# Patient Record
Sex: Male | Born: 1945
Health system: Southern US, Community
[De-identification: ages and names within clinical notes are randomized; demographics above are authoritative.]

## PROBLEM LIST (undated history)

## (undated) DIAGNOSIS — H409 Unspecified glaucoma: Secondary | ICD-10-CM

## (undated) DIAGNOSIS — E785 Hyperlipidemia, unspecified: Secondary | ICD-10-CM

## (undated) DIAGNOSIS — C61 Malignant neoplasm of prostate: Secondary | ICD-10-CM

## (undated) DIAGNOSIS — H269 Unspecified cataract: Secondary | ICD-10-CM

## (undated) DIAGNOSIS — K219 Gastro-esophageal reflux disease without esophagitis: Secondary | ICD-10-CM

## (undated) HISTORY — DX: Hyperlipidemia, unspecified: E78.5

## (undated) HISTORY — DX: Unspecified glaucoma: H40.9

## (undated) HISTORY — PX: OTHER SURGICAL HISTORY: SHX169

## (undated) HISTORY — DX: Unspecified cataract: H26.9

## (undated) HISTORY — PX: PITUITARY EXCISION: SHX745

## (undated) HISTORY — PX: PROSTATE BIOPSY: SHX241

## (undated) HISTORY — DX: Gastro-esophageal reflux disease without esophagitis: K21.9

## (undated) HISTORY — PX: CATARACT EXTRACTION: SUR2

---

## 2002-08-31 ENCOUNTER — Ambulatory Visit (HOSPITAL_BASED_OUTPATIENT_CLINIC_OR_DEPARTMENT_OTHER): Admission: RE | Admit: 2002-08-31 | Discharge: 2002-08-31 | Payer: Self-pay | Admitting: Plastic Surgery

## 2002-08-31 ENCOUNTER — Encounter (INDEPENDENT_AMBULATORY_CARE_PROVIDER_SITE_OTHER): Payer: Self-pay | Admitting: *Deleted

## 2005-03-29 ENCOUNTER — Ambulatory Visit: Payer: Self-pay | Admitting: Family Medicine

## 2005-04-04 ENCOUNTER — Ambulatory Visit: Payer: Self-pay | Admitting: Family Medicine

## 2005-04-17 ENCOUNTER — Ambulatory Visit: Payer: Self-pay | Admitting: Gastroenterology

## 2005-05-02 ENCOUNTER — Ambulatory Visit: Payer: Self-pay | Admitting: Gastroenterology

## 2005-08-30 ENCOUNTER — Ambulatory Visit: Payer: Self-pay | Admitting: Family Medicine

## 2005-09-02 ENCOUNTER — Ambulatory Visit: Payer: Self-pay | Admitting: Family Medicine

## 2005-09-06 ENCOUNTER — Ambulatory Visit: Payer: Self-pay | Admitting: Family Medicine

## 2006-06-26 ENCOUNTER — Ambulatory Visit: Payer: Self-pay | Admitting: Family Medicine

## 2006-06-26 LAB — CONVERTED CEMR LAB
ALT: 25 units/L (ref 0–40)
Albumin: 4.1 g/dL (ref 3.5–5.2)
BUN: 11 mg/dL (ref 6–23)
CO2: 32 meq/L (ref 19–32)
Chloride: 102 meq/L (ref 96–112)
Chol/HDL Ratio, serum: 3.4
Glomerular Filtration Rate, Af Am: 61 mL/min/{1.73_m2}
HCT: 43.8 % (ref 39.0–52.0)
HDL: 56.9 mg/dL (ref >39.0)
LDL Cholesterol: 119 mg/dL — ABNORMAL HIGH (ref 0–99)
MCHC: 33.4 g/dL (ref 30.0–36.0)
MCV: 86.3 fL (ref 78.0–100.0)
PSA: 1.3 ng/mL (ref 0.10–4.00)
Potassium: 4.2 meq/L (ref 3.5–5.1)
Sodium: 140 meq/L (ref 135–145)
TSH: 0.95 microintl units/mL (ref 0.35–5.50)
Total Bilirubin: 1.2 mg/dL (ref 0.3–1.2)
Total Protein: 7.2 g/dL (ref 6.0–8.3)

## 2007-03-03 ENCOUNTER — Emergency Department (HOSPITAL_COMMUNITY): Admission: EM | Admit: 2007-03-03 | Discharge: 2007-03-03 | Payer: Self-pay | Admitting: Emergency Medicine

## 2007-04-10 DIAGNOSIS — E785 Hyperlipidemia, unspecified: Secondary | ICD-10-CM | POA: Insufficient documentation

## 2007-04-10 DIAGNOSIS — K219 Gastro-esophageal reflux disease without esophagitis: Secondary | ICD-10-CM | POA: Insufficient documentation

## 2007-06-01 ENCOUNTER — Ambulatory Visit: Payer: Self-pay | Admitting: Family Medicine

## 2007-07-09 ENCOUNTER — Ambulatory Visit: Payer: Self-pay | Admitting: Family Medicine

## 2007-08-13 ENCOUNTER — Emergency Department (HOSPITAL_COMMUNITY): Admission: EM | Admit: 2007-08-13 | Discharge: 2007-08-13 | Payer: Self-pay | Admitting: Emergency Medicine

## 2007-08-19 ENCOUNTER — Ambulatory Visit: Payer: Self-pay | Admitting: Family Medicine

## 2007-09-02 ENCOUNTER — Encounter: Payer: Self-pay | Admitting: Family Medicine

## 2008-09-13 ENCOUNTER — Ambulatory Visit: Payer: Self-pay | Admitting: Family Medicine

## 2008-09-13 LAB — CONVERTED CEMR LAB
Bilirubin Urine: NEGATIVE
Glucose, Urine, Semiquant: NEGATIVE
Nitrite: NEGATIVE
Specific Gravity, Urine: 1.025
Urobilinogen, UA: 0.2

## 2008-09-19 LAB — CONVERTED CEMR LAB
ALT: 24 units/L (ref 0–53)
Albumin: 4 g/dL (ref 3.5–5.2)
Basophils Absolute: 0 10*3/uL (ref 0.0–0.1)
Basophils Relative: 0.5 % (ref 0.0–3.0)
Cholesterol: 156 mg/dL (ref 0–200)
Creatinine, Ser: 1.4 mg/dL (ref 0.4–1.5)
Eosinophils Relative: 3.7 % (ref 0.0–5.0)
GFR calc Af Amer: 66 mL/min
GFR calc non Af Amer: 55 mL/min
Glucose, Bld: 82 mg/dL (ref 70–99)
HDL: 62.2 mg/dL (ref >39.0)
LDL Cholesterol: 82 mg/dL (ref 0–99)
MCHC: 33.5 g/dL (ref 30.0–36.0)
MCV: 87.6 fL (ref 78.0–100.0)
PSA: 1.44 ng/mL (ref 0.10–4.00)
Platelets: 171 10*3/uL (ref 150–400)
RBC: 4.62 M/uL (ref 4.22–5.81)
RDW: 12.4 % (ref 11.5–14.6)
Sodium: 145 meq/L (ref 135–145)
Total CHOL/HDL Ratio: 2.5
Triglycerides: 57 mg/dL (ref 0–149)
VLDL: 11 mg/dL (ref 0–40)
WBC: 3.3 10*3/uL — ABNORMAL LOW (ref 4.5–10.5)

## 2008-09-22 ENCOUNTER — Ambulatory Visit: Payer: Self-pay | Admitting: Family Medicine

## 2009-03-02 ENCOUNTER — Ambulatory Visit: Payer: Self-pay | Admitting: Family Medicine

## 2009-05-18 ENCOUNTER — Ambulatory Visit: Payer: Self-pay | Admitting: Family Medicine

## 2009-05-18 LAB — CONVERTED CEMR LAB
Blood in Urine, dipstick: NEGATIVE
Nitrite: NEGATIVE
Protein, U semiquant: NEGATIVE
Specific Gravity, Urine: 1.01
Urobilinogen, UA: 0.2
pH: 6

## 2009-10-16 ENCOUNTER — Ambulatory Visit: Payer: Self-pay | Admitting: Family Medicine

## 2009-10-16 LAB — CONVERTED CEMR LAB
Blood in Urine, dipstick: NEGATIVE
Ketones, urine, test strip: NEGATIVE
Specific Gravity, Urine: 1.015
Urobilinogen, UA: 0.2
WBC Urine, dipstick: NEGATIVE
pH: 8

## 2009-10-19 LAB — CONVERTED CEMR LAB
ALT: 20 units/L (ref 0–53)
AST: 22 units/L (ref 0–37)
Alkaline Phosphatase: 55 units/L (ref 39–117)
Basophils Absolute: 0 10*3/uL (ref 0.0–0.1)
Basophils Relative: 0.6 % (ref 0.0–3.0)
Bilirubin, Direct: 0.1 mg/dL (ref 0.0–0.3)
Cholesterol: 224 mg/dL — ABNORMAL HIGH (ref 0–200)
Direct LDL: 144.2 mg/dL
Eosinophils Absolute: 0.1 10*3/uL (ref 0.0–0.7)
Eosinophils Relative: 3.4 % (ref 0.0–5.0)
GFR calc non Af Amer: 71.46 mL/min (ref >60)
Hemoglobin: 13.5 g/dL (ref 13.0–17.0)
Lymphs Abs: 1.6 10*3/uL (ref 0.7–4.0)
MCV: 87.8 fL (ref 78.0–100.0)
Neutro Abs: 1.4 10*3/uL (ref 1.4–7.7)
Neutrophils Relative %: 39.8 % — ABNORMAL LOW (ref 43.0–77.0)
PSA: 1.65 ng/mL (ref 0.10–4.00)
RBC: 4.64 M/uL (ref 4.22–5.81)
Sodium: 142 meq/L (ref 135–145)
TSH: 0.93 microintl units/mL (ref 0.35–5.50)
Total CHOL/HDL Ratio: 4
Total Protein: 7 g/dL (ref 6.0–8.3)
Triglycerides: 101 mg/dL (ref 0.0–149.0)
WBC: 3.6 10*3/uL — ABNORMAL LOW (ref 4.5–10.5)

## 2009-10-23 ENCOUNTER — Ambulatory Visit: Payer: Self-pay | Admitting: Family Medicine

## 2009-11-21 ENCOUNTER — Telehealth: Payer: Self-pay

## 2010-09-16 LAB — CONVERTED CEMR LAB
Albumin: 4.3 g/dL (ref 3.5–5.2)
Alkaline Phosphatase: 59 units/L (ref 39–117)
Basophils Absolute: 0 10*3/uL (ref 0.0–0.1)
Basophils Relative: 0.4 % (ref 0.0–1.0)
Chloride: 103 meq/L (ref 96–112)
Cholesterol: 199 mg/dL (ref 0–200)
Creatinine, Ser: 1.4 mg/dL (ref 0.4–1.5)
Eosinophils Absolute: 0.2 10*3/uL (ref 0.0–0.6)
GFR calc Af Amer: 66 mL/min
GFR calc non Af Amer: 55 mL/min
Glucose, Bld: 81 mg/dL (ref 70–99)
HCT: 42.7 % (ref 39.0–52.0)
MCV: 85.9 fL (ref 78.0–100.0)
Monocytes Absolute: 0.5 10*3/uL (ref 0.2–0.7)
Monocytes Relative: 11.5 % — ABNORMAL HIGH (ref 3.0–11.0)
Neutrophils Relative %: 36 % — ABNORMAL LOW (ref 43.0–77.0)
Sodium: 142 meq/L (ref 135–145)
Total CHOL/HDL Ratio: 3
Triglycerides: 58 mg/dL (ref 0–149)
VLDL: 12 mg/dL (ref 0–40)

## 2010-09-20 NOTE — Progress Notes (Signed)
Summary: Pt misplaced written script for Lipitor. Req med be called in  Phone Note Call from Patient Call back at [phone] cell   Caller: Patient Summary of Call: Pt called and lost his written script for Lipitor. Is req that it be called in to [facility]. Initial call taken by: Lucy Antigua,  November 21, 2009 12:01 PM    Prescriptions: LIPITOR 20 MG  TABS (ATORVASTATIN CALCIUM) Take 1 tablet by mouth once a day  #30 x 11   Entered by:   Duard Brady LPN   Authorized by:   Nelwyn Salisbury MD   Signed by:   Duard Brady LPN on 09/81/1914   Method used:   Electronically to        [facility] (retail)       [address]       [place], [place]  [id number]       Ph: [phone]       Fax: [phone]   RxID:   [id number]  efilled to [facility] - called pt - voice mail - LMTCB if questions - rx done. KIK

## 2010-09-20 NOTE — Assessment & Plan Note (Signed)
Summary: cpx/njr   Vital Signs:  Patient profile:   65 year old male Weight:      170 pounds BMI:     26.72 Temp:     98.0 degrees F oral Pulse rate:   72 / minute Pulse rhythm:   regular BP sitting:   100 / 72  (left arm) Cuff size:   regular  Vitals Entered By: Raechel Ache, RN (October 23, 2009 10:21 AM) CC: CPX,  labs done. Denies complaints. Stopped Lipitor last fall.   History of Present Illness: 65 yr old male for cpx. He feels fine and has no concerns. He admits to stopping his Lipitor about 3 months ago to see what happens to his lipid panel. As predicted his LDL went up to 144.   Allergies (verified): No Known Drug Allergies  Past History:  Past Medical History: Reviewed history from 04/10/2007 and no changes required. Hyperlipidemia GERD  Past Surgical History: Reviewed history from 07/09/2007 and no changes required. Colonoscopy-05/02/2005 per Dr. Arlyce Dice, repeat in 10 yrs left corneal implant per Dr. Dione Booze  Family History: Reviewed history from 04/10/2007 and no changes required. Fam hx Stroke  Social History: Reviewed history from 04/10/2007 and no changes required. Married Never Smoked Alcohol use-no  Review of Systems  The patient denies anorexia, fever, weight loss, weight gain, vision loss, decreased hearing, hoarseness, chest pain, syncope, dyspnea on exertion, peripheral edema, prolonged cough, headaches, hemoptysis, abdominal pain, melena, hematochezia, severe indigestion/heartburn, hematuria, incontinence, genital sores, muscle weakness, suspicious skin lesions, transient blindness, difficulty walking, depression, unusual weight change, abnormal bleeding, enlarged lymph nodes, angioedema, breast masses, and testicular masses.    Physical Exam  General:  Well-developed,well-nourished,in no acute distress; alert,appropriate and cooperative throughout examination Head:  Normocephalic and atraumatic without obvious abnormalities. No apparent  alopecia or balding. Eyes:  No corneal or conjunctival inflammation noted. EOMI. Perrla. Funduscopic exam benign, without hemorrhages, exudates or papilledema. Vision grossly normal. Ears:  External ear exam shows no significant lesions or deformities.  Otoscopic examination reveals clear canals, tympanic membranes are intact bilaterally without bulging, retraction, inflammation or discharge. Hearing is grossly normal bilaterally. Nose:  External nasal examination shows no deformity or inflammation. Nasal mucosa are pink and moist without lesions or exudates. Mouth:  Oral mucosa and oropharynx without lesions or exudates.  Teeth in good repair. Neck:  No deformities, masses, or tenderness noted. Chest Wall:  No deformities, masses, tenderness or gynecomastia noted. Lungs:  Normal respiratory effort, chest expands symmetrically. Lungs are clear to auscultation, no crackles or wheezes. Heart:  Normal rate and regular rhythm. S1 and S2 normal without gallop, murmur, click, rub or other extra sounds. EKG normal Abdomen:  Bowel sounds positive,abdomen soft and non-tender without masses, organomegaly or hernias noted. Rectal:  No external abnormalities noted. Normal sphincter tone. No rectal masses or tenderness. Heme neg. Genitalia:  Testes bilaterally descended without nodularity, tenderness or masses. No scrotal masses or lesions. No penis lesions or urethral discharge. Prostate:  Prostate gland firm and smooth, no enlargement, nodularity, tenderness, mass, asymmetry or induration. Msk:  No deformity or scoliosis noted of thoracic or lumbar spine.   Pulses:  R and L carotid,radial,femoral,dorsalis pedis and posterior tibial pulses are full and equal bilaterally Extremities:  No clubbing, cyanosis, edema, or deformity noted with normal full range of motion of all joints.   Neurologic:  No cranial nerve deficits noted. Station and gait are normal. Plantar reflexes are down-going bilaterally. DTRs are  symmetrical throughout. Sensory, motor and coordinative  functions appear intact. Skin:  Intact without suspicious lesions or rashes Cervical Nodes:  No lymphadenopathy noted Axillary Nodes:  No palpable lymphadenopathy Inguinal Nodes:  No significant adenopathy Psych:  Cognition and judgment appear intact. Alert and cooperative with normal attention span and concentration. No apparent delusions, illusions, hallucinations   Impression & Recommendations:  Problem # 1:  WELL ADULT EXAM (ICD-V70.0)  Orders: Hemoccult Guaiac-1 spec.(in office) (82270) EKG w/ Interpretation (93000)  Complete Medication List: 1)  Lipitor 20 Mg Tabs (Atorvastatin calcium) .... Take 1 tablet by mouth once a day 2)  Multivitamins Tabs (Multiple vitamin) .... Once daily 3)  Bayer Aspirin 325 Mg Tabs (Aspirin) .... Once daily  Other Orders: Tdap => 45yrs IM (16109) Admin 1st Vaccine (60454)  Patient Instructions: 1)  It is important that you exercise reguarly at least 20 minutes 5 times a week. If you develop chest pain, have severe difficulty breathing, or feel very tired, stop exercising immediately and seek medical attention.  2)  You need to lose weight. Consider a lower calorie diet and regular exercise.  3)  We'll get back on Lipitor. Prescriptions: LIPITOR 20 MG  TABS (ATORVASTATIN CALCIUM) Take 1 tablet by mouth once a day  #30 x 11   Entered and Authorized by:   Nelwyn Salisbury MD   Signed by:   Nelwyn Salisbury MD on 10/23/2009   Method used:   Print then Give to Patient   RxID:   [id number]    Immunizations Administered:  Tetanus Vaccine:    Vaccine Type: Tdap    Site: left deltoid    Mfr: GlaxoSmithKline    Dose: 0.5 ml    Route: IM    Given by: Raechel Ache, RN    Exp. Date: 06/14/2010    Lot #: [id number]    VIS given: 07/07/07 version given October 23, 2009.

## 2010-11-29 ENCOUNTER — Other Ambulatory Visit: Payer: Self-pay | Admitting: Family Medicine

## 2010-11-29 NOTE — Telephone Encounter (Signed)
Rx sent to pharmacy   

## 2010-12-28 ENCOUNTER — Other Ambulatory Visit: Payer: Self-pay | Admitting: Family Medicine

## 2011-01-02 ENCOUNTER — Other Ambulatory Visit (INDEPENDENT_AMBULATORY_CARE_PROVIDER_SITE_OTHER): Payer: BC Managed Care – PPO | Admitting: Family Medicine

## 2011-01-02 DIAGNOSIS — Z0000 Encounter for general adult medical examination without abnormal findings: Secondary | ICD-10-CM

## 2011-01-02 LAB — POCT URINALYSIS DIPSTICK
Blood, UA: NEGATIVE
Glucose, UA: NEGATIVE
Leukocytes, UA: NEGATIVE
Spec Grav, UA: 0.2
Urobilinogen, UA: 0.2

## 2011-01-02 LAB — HEPATIC FUNCTION PANEL
ALT: 22 U/L (ref 0–53)
AST: 25 U/L (ref 0–37)
Albumin: 3.8 g/dL (ref 3.5–5.2)
Total Bilirubin: 0.6 mg/dL (ref 0.3–1.2)
Total Protein: 6.3 g/dL (ref 6.0–8.3)

## 2011-01-02 LAB — LIPID PANEL
HDL: 49.4 mg/dL (ref >39.00)
LDL Cholesterol: 80 mg/dL (ref 0–99)
VLDL: 21.2 mg/dL (ref 0.0–40.0)

## 2011-01-02 LAB — BASIC METABOLIC PANEL
BUN: 13 mg/dL (ref 6–23)
CO2: 28 mEq/L (ref 19–32)
GFR: 66.45 mL/min (ref >60.00)
Potassium: 4.4 mEq/L (ref 3.5–5.1)
Sodium: 141 mEq/L (ref 135–145)

## 2011-01-02 LAB — CBC WITH DIFFERENTIAL/PLATELET
Basophils Relative: 0.8 % (ref 0.0–3.0)
Eosinophils Absolute: 0.2 10*3/uL (ref 0.0–0.7)
Lymphs Abs: 1.6 10*3/uL (ref 0.7–4.0)
Monocytes Absolute: 0.4 10*3/uL (ref 0.1–1.0)
Neutro Abs: 1.5 10*3/uL (ref 1.4–7.7)
Platelets: 188 10*3/uL (ref 150.0–400.0)
RDW: 13.4 % (ref 11.5–14.6)

## 2011-01-04 NOTE — Op Note (Signed)
[name], Peter Bond                          ACCOUNT NO.:  [id number]   MEDICAL RECORD NO.:  [id number]                   PATIENT TYPE:  AMB   LOCATION:  DSC                                  FACILITY:  [facility]   PHYSICIAN:  Brantley Persons, M.D.             DATE OF BIRTH:  1946-05-24   DATE OF PROCEDURE:  08/31/2002  DATE OF DISCHARGE:                                 OPERATIVE REPORT   PREOPERATIVE DIAGNOSIS:  Basal cell cancer, right temporal scalp.   POSTOPERATIVE DIAGNOSIS:  Basal cell cancer, right temporal scalp.   OPERATION PERFORMED:  1. Excision of 4.5 centimeter basal cell cancer, right temporal scalp with     intraoperative frozen section diagnosis.  2. Complex closure, 6.0 centimeter excision, right temporal scalp.   SURGEON:  Brantley Persons, M.D.   ANESTHESIA:  One percent lidocaine with epinephrine.   COMPLICATIONS:  None.   INDICATIONS FOR SURGERY:  The patient is a 65 year old African-American male  who has a biopsy-proven basal cell cancer of the right temporal scalp.  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  The patient now presents to undergo excision of the complete  lesion.   DESCRIPTION OF OPERATION:  The patient was brought into the minor room and  placed on the table in a supine position.  The right scalp and face were  prepped with Betadine and draped in sterile fashion.  The skin and  subcutaneous tissue in the area of the skin lesion were injected with 1%  lidocaine with epinephrine.  After adequate hemostasis and anesthesia had  taken effect the procedure was begun.   Using the loupe magnification the borders of the basal cell cancer were  identified.  One to two millimeters margins were then marked  circumferentially around this border.  The lesion was thus excised full-  thickness through the skin into the subcutaneous tissue at the galeal layer.  The specimen was marked at the 12  o'clock position and passed off the table  to undergo an intraoperative frozen section diagnosis.  Dr. Almyra Free, the  pathologist reviewed the specimen while I was on standby.  She felt that  there was still tumor present at the 9 o'clock margin and felt that I should  reexcise the 6 to 12 o'clock margin.  I therefore proceeded with another 2-3  mm excision of this margin, marking the specimen at the 12 o'clock position  with the suture and inking the external margin.  This was then sent to the  pathologist while I was on standby.  Dr. Almyra Free reviewed the specimen and  felt that no more cancer was present; and, that our margins were clear.   At this point I proceeded with closure of the incision.  The skin flaps were  undermined at the galeal layer to provide mobility of the scalp.  The wound  was  then closed in complex fashion.  Meticulous hemostasis was obtained with  the Bovie electrocautery.  The galeal layer was closed with 2-0 Monocryl  suture.  The dermal layer was then closed with a 3-0 Monocryl suture.  The  skin was then closed with a 6-0 Prolene running baseball type stitch.  The  incision was dressed with bacitracin ointment.   There were no complications.   The patient tolerated the procedure well.  He was then discharged home in  stable condition with proper postoperative wound care instructions.  Follow  up appointment will be tomorrow in the office.                                                 Brantley Persons, M.D.    MC/MEDQ  D:  08/31/2002  T:  08/31/2002  Job:  [id number]

## 2011-01-04 NOTE — Progress Notes (Signed)
patient  Is aware 

## 2011-01-04 NOTE — Assessment & Plan Note (Signed)
[facility] OFFICE NOTE   Peter Bond, Peter Bond                       MRN:          [id number]  DATE:06/26/2006                            DOB:          20-Jun-1946    This is a 65 year old gentleman here for a complete physical examination.  He is doing well and has no particular complaints.  We have been treating  him primarily for hyperlipidemia.  He had some problems with acid reflux  earlier in the year, but this is completely resolved.   Further details of his past medical history, family history, social history,  habits, etc., refer to last physical exam dated April 04, 2005.   ALLERGIES:  None.   CURRENT MEDICATIONS:  Lipitor 20 mg once daily.   OBJECTIVE:  VITAL SIGNS:  Height 5 feet 7 inches.  Weight 172.  BP 104/82,  pulse 64 and regular.  GENERAL:  He appears to be healthy.  SKIN:  Clear.  HEENT:  Eyes clear.  Ears clear.  Pharynx clear.  NECK:  Supple without lymphadenopathy or masses.  LUNGS:  Clear.  HEART:  Regular rate and rhythm without murmurs, rubs, gallops, or lifts.  Pulses are full.  EKG is within normal limits.  ABDOMEN:  Soft.  Normal bowel sounds.  Nontender.  No masses.  GENITALIA:  Normal male.  RECTAL:  No masses or tenderness.  Prostate is within normal limits.  Stool  Hemoccult negative.  EXTREMITIES:  No clubbing, cyanosis or edema.  NEUROLOGIC:  Grossly intact.   ASSESSMENT/PLAN:  1. Complete physical examination.  Will check panel of the usual      laboratories.  He is not due for another colonoscopy until 2016.  2. Hyperlipidemia:  We will check fasting lipids today.    ______________________________  Tera Mater. Clent Ridges, MD    SAF/MedQ  DD: 06/26/2006  DT: 06/26/2006  Job #: [id number]

## 2011-01-09 ENCOUNTER — Encounter: Payer: Self-pay | Admitting: Family Medicine

## 2011-01-09 ENCOUNTER — Ambulatory Visit (INDEPENDENT_AMBULATORY_CARE_PROVIDER_SITE_OTHER): Payer: BC Managed Care – PPO | Admitting: Family Medicine

## 2011-01-09 VITALS — BP 114/76 | HR 82 | Temp 98.2°F | Ht 66.5 in | Wt 173.0 lb

## 2011-01-09 DIAGNOSIS — Z0000 Encounter for general adult medical examination without abnormal findings: Secondary | ICD-10-CM

## 2011-01-09 NOTE — Progress Notes (Signed)
  Subjective:    Patient ID: Peter Bond, male    DOB: 1945/11/27, 65 y.o.   MRN: [id number]  HPI 65 yr old male for a cpx. He feels well and has no concerns.    Review of Systems  Constitutional: Negative.   HENT: Negative.   Eyes: Negative.   Respiratory: Negative.   Cardiovascular: Negative.   Gastrointestinal: Negative.   Genitourinary: Negative.   Musculoskeletal: Negative.   Skin: Negative.   Neurological: Negative.   Hematological: Negative.   Psychiatric/Behavioral: Negative.        Objective:   Physical Exam  Constitutional: He is oriented to person, place, and time. He appears well-developed and well-nourished. No distress.  HENT:  Head: Normocephalic and atraumatic.  Right Ear: External ear normal.  Left Ear: External ear normal.  Nose: Nose normal.  Mouth/Throat: Oropharynx is clear and moist. No oropharyngeal exudate.  Eyes: Conjunctivae and EOM are normal. Pupils are equal, round, and reactive to light. Right eye exhibits no discharge. Left eye exhibits no discharge. No scleral icterus.  Neck: Neck supple. No JVD present. No tracheal deviation present. No thyromegaly present.  Cardiovascular: Normal rate, regular rhythm, normal heart sounds and intact distal pulses.  Exam reveals no gallop and no friction rub.   No murmur heard.      EKG normal   Pulmonary/Chest: Effort normal and breath sounds normal. No respiratory distress. He has no wheezes. He has no rales. He exhibits no tenderness.  Abdominal: Soft. Bowel sounds are normal. He exhibits no distension and no mass. There is no tenderness. There is no rebound and no guarding.  Genitourinary: Rectum normal, prostate normal and penis normal. Guaiac negative stool. No penile tenderness.  Musculoskeletal: Normal range of motion. He exhibits no edema and no tenderness.  Lymphadenopathy:    He has no cervical adenopathy.  Neurological: He is alert and oriented to person, place, and time. He has normal reflexes.  No cranial nerve deficit. He exhibits normal muscle tone. Coordination normal.  Skin: Skin is warm and dry. No rash noted. He is not diaphoretic. No erythema. No pallor.  Psychiatric: He has a normal mood and affect. His behavior is normal. Judgment and thought content normal.          Assessment & Plan:  Continue diet and exercise.

## 2011-02-05 ENCOUNTER — Other Ambulatory Visit: Payer: Self-pay | Admitting: Internal Medicine

## 2011-02-15 ENCOUNTER — Ambulatory Visit (HOSPITAL_COMMUNITY)
Admission: RE | Admit: 2011-02-15 | Discharge: 2011-02-15 | Disposition: A | Discharge status: Home / Self Care | Payer: BC Managed Care – PPO | Source: Ambulatory Visit | Attending: Neurological Surgery | Admitting: Neurological Surgery

## 2011-02-15 ENCOUNTER — Other Ambulatory Visit (HOSPITAL_COMMUNITY): Payer: Self-pay | Admitting: Neurological Surgery

## 2011-02-15 ENCOUNTER — Encounter (HOSPITAL_COMMUNITY)
Admission: RE | Admit: 2011-02-15 | Discharge: 2011-02-15 | Disposition: A | Discharge status: Home / Self Care | Payer: BC Managed Care – PPO | Source: Ambulatory Visit | Attending: Neurological Surgery | Admitting: Neurological Surgery

## 2011-02-15 DIAGNOSIS — D497 Neoplasm of unspecified behavior of endocrine glands and other parts of nervous system: Secondary | ICD-10-CM

## 2011-02-15 DIAGNOSIS — Z01818 Encounter for other preprocedural examination: Secondary | ICD-10-CM | POA: Insufficient documentation

## 2011-02-15 DIAGNOSIS — Z01812 Encounter for preprocedural laboratory examination: Secondary | ICD-10-CM | POA: Insufficient documentation

## 2011-02-15 LAB — TYPE AND SCREEN: ABO/RH(D): B POS

## 2011-02-15 LAB — BASIC METABOLIC PANEL
BUN: 9 mg/dL (ref 6–23)
CO2: 32 mEq/L (ref 19–32)
Chloride: 101 mEq/L (ref 96–112)
Glucose, Bld: 72 mg/dL (ref 70–99)
Potassium: 4.8 mEq/L (ref 3.5–5.1)
Sodium: 140 mEq/L (ref 135–145)

## 2011-02-15 LAB — CBC
Hemoglobin: 14.4 g/dL (ref 13.0–17.0)
MCHC: 35 g/dL (ref 30.0–36.0)
MCV: 83.2 fL (ref 78.0–100.0)

## 2011-02-21 ENCOUNTER — Inpatient Hospital Stay (HOSPITAL_COMMUNITY): Payer: BC Managed Care – PPO

## 2011-02-21 ENCOUNTER — Inpatient Hospital Stay (HOSPITAL_COMMUNITY)
Admission: RE | Admit: 2011-02-21 | Discharge: 2011-02-23 | DRG: 286 | Disposition: A | Discharge status: Home / Self Care | Payer: BC Managed Care – PPO | Source: Ambulatory Visit | Attending: Neurological Surgery | Admitting: Neurological Surgery

## 2011-02-21 ENCOUNTER — Other Ambulatory Visit: Payer: Self-pay | Admitting: Neurological Surgery

## 2011-02-21 DIAGNOSIS — D352 Benign neoplasm of pituitary gland: Principal | ICD-10-CM | POA: Diagnosis present

## 2011-02-22 LAB — CBC
Hemoglobin: 13.4 g/dL (ref 13.0–17.0)
MCHC: 35.5 g/dL (ref 30.0–36.0)
MCV: 81.8 fL (ref 78.0–100.0)
RBC: 4.61 MIL/uL (ref 4.22–5.81)
WBC: 11.2 10*3/uL — ABNORMAL HIGH (ref 4.0–10.5)

## 2011-02-22 LAB — BASIC METABOLIC PANEL
Calcium: 8.6 mg/dL (ref 8.4–10.5)
Creatinine, Ser: 1.3 mg/dL (ref 0.50–1.35)
GFR calc non Af Amer: 55 mL/min — ABNORMAL LOW (ref >60)
Glucose, Bld: 173 mg/dL — ABNORMAL HIGH (ref 70–99)

## 2011-03-14 NOTE — Op Note (Signed)
NAMETREVELLE, MCGURN NO.:  [id number]  MEDICAL RECORD NO.:  [id number]  LOCATION:  3112                         FACILITY:  [facility]  PHYSICIAN:  Suzanna Obey, M.D.       DATE OF BIRTH:  1946/04/04  DATE OF PROCEDURE:  02/21/2011 DATE OF DISCHARGE:                              OPERATIVE REPORT   PREOPERATIVE DIAGNOSIS:  Pituitary tumor.  POSTOPERATIVE DIAGNOSIS:  Pituitary tumor.  SURGICAL PROCEDURE:  Transsphenoidal hypophysectomy with nasal and sphenoid approach.  SURGEON:  Suzanna Obey, MD  ANESTHESIA:  General.  ESTIMATED BLOOD LOSS:  Approximately 25 mL.  INDICATIONS:  This is a 65 year old who has a tumor in his pituitary evaluated by Dr. Danielle Dess and consultation with me for approach through the nasal septum and sphenoid.  The patient was informed of risks and benefits of the procedure and options were discussed.  All his questions were answered and consent was obtained.  OPERATION IN DETAIL:  The patient was taken to the operating room and placed in supine position.  After general endotracheal tube anesthesia and positioning by Dr. Danielle Dess of the head, the patient was prepped and draped in the usual sterile manner with a marking pen.  The reverse gull- wing incision was outlined on the columella and opened with the #11 blade after injecting 1% lidocaine with 1:100,000 epinephrine in the septum, columella, and floor of the nose.  The dissection was carried down inferiorly where the right-sided medial crura was identified.  It was freed up off its attachment and then an incision was made further along the septum inside the nose and this flap was elevated superiorly, allowing the septum to be exposed in the anterior strut of the septum. The dissection was then carried down to the nasal spine where it was removed with a 4-mm osteotome and freed this up.  The cartilage was divided about 2 cm close to the caudal strut after elevating a nasal septal cartilage  flap.  Two tunnels were then created on each side along the floor of the nose making a #15 blade incision right at the anterior aspect of the nose and this allowed the septum and columella with the medial crura to swing over into the left side of the nasal cavity.  The cartilage was then identified.  Flaps were elevated on each side of the cartilage and bone, dissecting back to the sphenoid rostrum.  This bone and cartilage was removed with the Laren Boom forceps and large pieces were preserved for repair of the sphenoid face later.  Once the sphenoid face was identified, a C-arm view was taken and the suction was on the anterior aspect of the sphenoid sinus.  A 4-mm osteotome was then used to open the face of the sphenoid.  It opened up on each side and the inner sinus septum was identified.  This was deviating toward the left side of the patient, so it was removed but then the midline was identified and using the Kerrison, the sphenoid opening was opened wider and then the case was turned over to Dr. Danielle Dess for tumor removal.  Once this was performed, he had placed fat into the sella and then a piece  of cartilage were placed over the face of the sphenoid opening and the flaps were laid back into the anatomic position.  The columella was brought back into midline and a suture was used to secure it back to its nasal spine.  This was a 5-0 nylon and the medial crura was secured back to its base with a 4-0 chromic.  The reverse gull-wing incision closed with interrupted 5-0 nylon and the hemitransfixion incision with interrupted 4-0 chromic.  4-0 plain gut quilting stitch was used to quilt the septal flaps back into position and the crura was secured also against the columella with 4-0 plain gut quilting stitches.  This corrected the defect and Telfa rolls soaked in Bactroban or bacitracin was placed into each side of the nose and secured with a 3-0 nylon.  The patient was then  awakened and brought to the recovery room in stable condition.  Counts were correct.          ______________________________ Suzanna Obey, M.D.     JB/MEDQ  D:  02/21/2011  T:  02/22/2011  Job:  [id number]  Electronically Signed by Suzanna Obey M.D. on 03/14/2011 09:18:13 AM

## 2011-03-25 NOTE — Op Note (Signed)
NAMETAMON, PARKERSON NO.:  [id number]  MEDICAL RECORD NO.:  [id number]  LOCATION:  3112                         FACILITY:  [facility]  PHYSICIAN:  Stefani Dama, M.D.  DATE OF BIRTH:  03-05-1946  DATE OF PROCEDURE:  02/21/2011 DATE OF DISCHARGE:                              OPERATIVE REPORT   PREOPERATIVE DIAGNOSIS:  Pituitary macroadenoma with visual compromise.  POSTOPERATIVE DIAGNOSIS:  Pituitary macroadenoma with visual compromise.  OPERATION:  Transsphenoidal resection of the pituitary tumor.  SURGEON:  Stefani Dama, MD  CO-SURGEON:  Almond Lint, MD, for approach and closure.  HISTORY OF PRESENT ILLNESS:  Mr. Chun Sellen is a 65 year old individual who has had significant change in his vision, this was characterized by bitemporal hemianopsia and the patient was sent for an MRI of the brain which demonstrated presence of a large pituitary macroadenoma.  He was then evaluated neurosurgically and blood test revealed this to be non-secreting pituitary adenoma because of the visual compromise and with bitemporal hemianopsia, the patient was advised regarding need for surgical decompression.  PROCEDURE IN DETAIL:  The patient was brought to the operating room supine on the stretcher after smooth induction of general endotracheal anesthesia.  He was placed in 3-pin headrest and placed into the chaise lounge position, looking with his head tilted to the left and facing towards the surgeon's approach up to the nose to the right side.  The procedure was started by Dr. Suzanna Obey, who performed an approached via the transsphenoidal root.  Once we entered, the sphenoid sinus had evacuated its mucosa.  I started my portion of the surgery, examination of the posterior wall of the sphenoid sinus located in area of very thin bone which was easily penetrable with a blunt nerve hook.  A 2-mm Kerrison punch was then used to open a window that  measured approximately 8 mm on square.  The dura in this area was noted be very soft, fluctuant.  It was opened with a 15-blade in a cruciate fashion. Then we used a combination of curettes and rongeurs to evacuate the pituitary itself.  It was noted be a very gelatinous pinkish tumor mass that presented itself readily.  A series of ring curettes were then used to facilitate removal of the harder well-formed gelatinous tumor.  In the end, the entire contents of the pituitary were evacuated that could not identify a structure such stalk on this dissection.  The area was irrigated copiously and after all the tumor had been removed by palpation using a series of ring curettes in the pituitary fossa. Hemostasis was established by placing a single pledget of Gelfoam soaked thrombin into the pituitary fossa.  This was allowed to rest for 5 minutes while I prepared and obtained a fat graft from the right upper quadrant.  The incisions for this fat graft was then closed with 3-0 Vicryl interrupted fashion and Dermabond was placed on the skin.  Once this was accomplished, the cottonoid patty was removed from the pituitary fossa and the Gelfoam was irrigated away.  No further bleeding was identified.  A piece of the nasal cartilage was then cut to the appropriate size and placed at the  trap door into the pituitary fossa opening.  At this point, procedure was turned over to Dr. Jearld Fenton, for final closure nasal reconstruction.  A good portion of specimen was sent via nasal suction trap for pathologic evaluation.     Stefani Dama, M.D.     Merla Riches  D:  02/21/2011  T:  02/22/2011  Job:  [id number]  Electronically Signed by Barnett Abu M.D. on 03/25/2011 07:29:06 AM

## 2011-03-25 NOTE — Discharge Summary (Signed)
  NAMEMACDONALD, RIGOR NO.:  [id number]  MEDICAL RECORD NO.:  [id number]  LOCATION:  3041                         FACILITY:  [facility]  PHYSICIAN:  Stefani Dama, M.D.  DATE OF BIRTH:  [birth year]  DATE OF ADMISSION:  02/21/2011 DATE OF DISCHARGE:  02/23/2011                              DISCHARGE SUMMARY   ADMITTING DIAGNOSIS:  Pituitary macroadenoma with visual compromise.  DISCHARGE DIAGNOSIS:  Pituitary macroadenoma with visual compromise.  FINAL DIAGNOSIS:  Pituitary macroadenoma with visual compromise.  OPERATION:  Transsphenoidal resection of pituitary tumor on February 21, 2011.  CONDITION ON DISCHARGE:  Improving.  HOSPITAL COURSE:  Peter Bond is a 65 year old individual who had been having some visual problems.  He was ultimately diagnosed having to pituitary macroadenoma with visual field cuts and bitemporal hemianopsia.  The patient had endocrinologic studies, which demonstrated that this was a nonsecreting tumor and after consultation he was advised regarding need for surgical intervention which was performed on February 21, 2011. Postoperatively, the patient did not have any problems with diabetes insipidus nor that he had any other hormonal abnormalities.  Vital signs have remained stable.  Drainage from his nasal packs has been minimal; and at the current time, he is feeling reasonably well, taking only some occasional narcotic pain medication, and being maintained on some low doses of prednisone.  He is discharged home at this time with a prescription for prednisone 5 mg a day for 7 days.  He is also given Keflex 500 mg 4 times a day while the nasal packings were present.  He is given a prescription for hydrocodone, #40 without refills.  He will be seen in the office by Dr. Suzanna Obey for his nasal pack removal.  I will see him in a few weeks for further followup.  Subjectively, the patient reports that his vision feels better.     Stefani Dama,  M.D.     Peter Bond  D:  02/23/2011  T:  02/23/2011  Job:  [id number]  Electronically Signed by Barnett Abu M.D. on 03/25/2011 07:29:17 AM

## 2011-03-26 ENCOUNTER — Other Ambulatory Visit: Payer: Self-pay | Admitting: Family Medicine

## 2011-08-22 ENCOUNTER — Telehealth: Payer: Self-pay | Admitting: Family Medicine

## 2011-08-22 NOTE — Telephone Encounter (Signed)
Pt is having drainage and is causing him to cough for 2 days and is mostly at night. Pt is wanting something to help the cough. Please contact

## 2011-08-22 NOTE — Telephone Encounter (Signed)
Pt can take robitussin or delsym, if not then a office visit.

## 2011-08-22 NOTE — Telephone Encounter (Signed)
Pt called back req status of getting a cough med. Pt has been informed that he can take either Robitussin or delsym, if not pt would need to sch an ov. Pt said that he was going to try the Delsym, as noted.

## 2011-11-09 ENCOUNTER — Other Ambulatory Visit: Payer: Self-pay | Admitting: Family Medicine

## 2012-03-11 ENCOUNTER — Ambulatory Visit (INDEPENDENT_AMBULATORY_CARE_PROVIDER_SITE_OTHER): Payer: Medicare Other | Admitting: Family Medicine

## 2012-03-11 ENCOUNTER — Encounter: Payer: Self-pay | Admitting: Family Medicine

## 2012-03-11 VITALS — BP 130/86 | HR 82 | Temp 98.0°F | Ht 66.0 in | Wt 170.0 lb

## 2012-03-11 DIAGNOSIS — E785 Hyperlipidemia, unspecified: Secondary | ICD-10-CM

## 2012-03-11 DIAGNOSIS — K219 Gastro-esophageal reflux disease without esophagitis: Secondary | ICD-10-CM

## 2012-03-11 DIAGNOSIS — N139 Obstructive and reflux uropathy, unspecified: Secondary | ICD-10-CM

## 2012-03-11 DIAGNOSIS — N401 Enlarged prostate with lower urinary tract symptoms: Secondary | ICD-10-CM

## 2012-03-11 LAB — LIPID PANEL
LDL Cholesterol: 101 mg/dL — ABNORMAL HIGH (ref 0–99)
Total CHOL/HDL Ratio: 3
Triglycerides: 85 mg/dL (ref 0.0–149.0)

## 2012-03-11 LAB — POCT URINALYSIS DIPSTICK
Bilirubin, UA: NEGATIVE
Glucose, UA: NEGATIVE
Ketones, UA: NEGATIVE
Leukocytes, UA: NEGATIVE
pH, UA: 6.5

## 2012-03-11 LAB — HEPATIC FUNCTION PANEL
AST: 23 U/L (ref 0–37)
Total Bilirubin: 1 mg/dL (ref 0.3–1.2)

## 2012-03-11 LAB — TSH: TSH: 0.7 u[IU]/mL (ref 0.35–5.50)

## 2012-03-11 LAB — CBC WITH DIFFERENTIAL/PLATELET
Basophils Relative: 0.6 % (ref 0.0–3.0)
Eosinophils Relative: 3.5 % (ref 0.0–5.0)
Hemoglobin: 14.4 g/dL (ref 13.0–17.0)
Lymphocytes Relative: 41.4 % (ref 12.0–46.0)
Monocytes Relative: 11.8 % (ref 3.0–12.0)
Neutro Abs: 1.6 10*3/uL (ref 1.4–7.7)
RBC: 5.03 Mil/uL (ref 4.22–5.81)
WBC: 3.7 10*3/uL — ABNORMAL LOW (ref 4.5–10.5)

## 2012-03-11 LAB — PSA: PSA: 2.39 ng/mL (ref 0.10–4.00)

## 2012-03-11 LAB — BASIC METABOLIC PANEL
Chloride: 103 mEq/L (ref 96–112)
Creatinine, Ser: 1.2 mg/dL (ref 0.4–1.5)

## 2012-03-11 MED ORDER — ATORVASTATIN CALCIUM 20 MG PO TABS: 20.0000 mg | ORAL_TABLET | Freq: Every day | ORAL | Status: DC

## 2012-03-11 NOTE — Progress Notes (Signed)
  Subjective:    Patient ID: Peter Bond, male    DOB: Nov 08, 1945, 66 y.o.   MRN: [id number]  HPI 66 yr old male for a cpx. He feels fine and has no concerns.    Review of Systems  Constitutional: Negative.   HENT: Negative.   Eyes: Negative.   Respiratory: Negative.   Cardiovascular: Negative.   Gastrointestinal: Negative.   Genitourinary: Negative.   Musculoskeletal: Negative.   Skin: Negative.   Neurological: Negative.   Hematological: Negative.   Psychiatric/Behavioral: Negative.        Objective:   Physical Exam  Constitutional: He is oriented to person, place, and time. He appears well-developed and well-nourished. No distress.  HENT:  Head: Normocephalic and atraumatic.  Right Ear: External ear normal.  Left Ear: External ear normal.  Nose: Nose normal.  Mouth/Throat: Oropharynx is clear and moist. No oropharyngeal exudate.  Eyes: Conjunctivae and EOM are normal. Pupils are equal, round, and reactive to light. Right eye exhibits no discharge. Left eye exhibits no discharge. No scleral icterus.  Neck: Neck supple. No JVD present. No tracheal deviation present. No thyromegaly present.  Cardiovascular: Normal rate, regular rhythm, normal heart sounds and intact distal pulses.  Exam reveals no gallop and no friction rub.   No murmur heard.      EKG normal   Pulmonary/Chest: Effort normal and breath sounds normal. No respiratory distress. He has no wheezes. He has no rales. He exhibits no tenderness.  Abdominal: Soft. Bowel sounds are normal. He exhibits no distension and no mass. There is no tenderness. There is no rebound and no guarding.  Genitourinary: Rectum normal, prostate normal and penis normal. Guaiac negative stool. No penile tenderness.  Musculoskeletal: Normal range of motion. He exhibits no edema and no tenderness.  Lymphadenopathy:    He has no cervical adenopathy.  Neurological: He is alert and oriented to person, place, and time. He has normal reflexes.  No cranial nerve deficit. He exhibits normal muscle tone. Coordination normal.  Skin: Skin is warm and dry. No rash noted. He is not diaphoretic. No erythema. No pallor.  Psychiatric: He has a normal mood and affect. His behavior is normal. Judgment and thought content normal.          Assessment & Plan:  Well exam. Get fasting labs

## 2012-03-12 NOTE — Progress Notes (Signed)
Quick Note:  I left voice message with normal results. ______ 

## 2012-05-22 ENCOUNTER — Other Ambulatory Visit: Payer: Self-pay | Admitting: Neurological Surgery

## 2012-05-22 DIAGNOSIS — D497 Neoplasm of unspecified behavior of endocrine glands and other parts of nervous system: Secondary | ICD-10-CM

## 2012-05-28 ENCOUNTER — Ambulatory Visit
Admission: RE | Admit: 2012-05-28 | Discharge: 2012-05-28 | Disposition: A | Discharge status: Home / Self Care | Payer: Medicare Other | Source: Ambulatory Visit | Attending: Neurological Surgery | Admitting: Neurological Surgery

## 2012-05-28 DIAGNOSIS — D497 Neoplasm of unspecified behavior of endocrine glands and other parts of nervous system: Secondary | ICD-10-CM

## 2012-05-28 MED ORDER — GADOBENATE DIMEGLUMINE 529 MG/ML IV SOLN
8.0000 mL | Freq: Once | INTRAVENOUS | Status: AC | PRN
  Administered 2012-05-28: 8 mL via INTRAVENOUS

## 2013-03-31 ENCOUNTER — Ambulatory Visit (INDEPENDENT_AMBULATORY_CARE_PROVIDER_SITE_OTHER): Payer: Medicare Other | Admitting: Internal Medicine

## 2013-03-31 ENCOUNTER — Encounter: Payer: Self-pay | Admitting: Internal Medicine

## 2013-03-31 VITALS — BP 140/88 | HR 80 | Temp 96.7°F | Resp 16 | Wt 173.0 lb

## 2013-03-31 DIAGNOSIS — R030 Elevated blood-pressure reading, without diagnosis of hypertension: Secondary | ICD-10-CM | POA: Insufficient documentation

## 2013-03-31 DIAGNOSIS — M79609 Pain in unspecified limb: Secondary | ICD-10-CM

## 2013-03-31 DIAGNOSIS — E785 Hyperlipidemia, unspecified: Secondary | ICD-10-CM

## 2013-03-31 DIAGNOSIS — M79604 Pain in right leg: Secondary | ICD-10-CM

## 2013-03-31 MED ORDER — IBUPROFEN 600 MG PO TABS: 600.0000 mg | ORAL_TABLET | Freq: Three times a day (TID) | ORAL | Status: DC | PRN

## 2013-03-31 MED ORDER — CYCLOBENZAPRINE HCL 5 MG PO TABS: 5.0000 mg | ORAL_TABLET | Freq: Every evening | ORAL | Status: DC | PRN

## 2013-03-31 NOTE — Assessment & Plan Note (Signed)
Hold Lipitor x 2 wks

## 2013-03-31 NOTE — Assessment & Plan Note (Signed)
8/14 acute - likely IT band irritation/tendonitis  Ibuprofen and Flexeril prn IT band stretching X ray if needed Dr Clent Ridges in 2 wks

## 2013-03-31 NOTE — Patient Instructions (Addendum)
[email or web address] ------- IT band stretchng, hip opening exercises

## 2013-03-31 NOTE — Assessment & Plan Note (Signed)
8/14 mild - will watch

## 2013-03-31 NOTE — Progress Notes (Signed)
  Subjective:    Patient ID: Peter Bond, male    DOB: 15-Mar-1946, 67 y.o.   MRN: [id number]  Leg Pain  The incident occurred 2 days ago. There was no injury mechanism. The pain is present in the right thigh (lateral R thigh). The quality of the pain is described as aching. The pain is moderate. The pain has been fluctuating since onset. Pertinent negatives include no muscle weakness or numbness. The symptoms are aggravated by movement (standing).  R leg    Review of Systems  Constitutional: Negative for chills.  Musculoskeletal: Negative for myalgias, back pain, arthralgias and gait problem.  Neurological: Negative for numbness.  Psychiatric/Behavioral: The patient is not nervous/anxious.        Objective:   Physical Exam  HENT:  Mouth/Throat: No oropharyngeal exudate.  Neck: Normal range of motion.  Pulmonary/Chest: He has no rales.  Abdominal: He exhibits no mass. There is no tenderness. There is no rebound.  Musculoskeletal: He exhibits tenderness.  IT band is sensitive B hips, LS spine - NT, str leg elev neg B  Neurological: He displays normal reflexes. Coordination normal.  Skin: No rash noted. He is not diaphoretic. No erythema.          Assessment & Plan:

## 2013-04-01 ENCOUNTER — Ambulatory Visit: Payer: Medicare Other | Admitting: Internal Medicine

## 2013-04-05 ENCOUNTER — Telehealth: Payer: Self-pay | Admitting: *Deleted

## 2013-04-05 NOTE — Telephone Encounter (Signed)
Prior auth for cyclobenzaprine hcl has been approved through 12.31.14.  Pharmacy made aware.

## 2013-05-02 ENCOUNTER — Other Ambulatory Visit: Payer: Self-pay | Admitting: Family Medicine

## 2013-05-03 NOTE — Telephone Encounter (Signed)
Can we refill this? 

## 2013-05-04 ENCOUNTER — Encounter: Payer: Self-pay | Admitting: Family Medicine

## 2013-05-04 ENCOUNTER — Ambulatory Visit (INDEPENDENT_AMBULATORY_CARE_PROVIDER_SITE_OTHER): Payer: Medicare Other | Admitting: Family Medicine

## 2013-05-04 VITALS — BP 120/80 | HR 81 | Temp 98.1°F | Ht 66.0 in | Wt 168.0 lb

## 2013-05-04 DIAGNOSIS — I10 Essential (primary) hypertension: Secondary | ICD-10-CM

## 2013-05-04 DIAGNOSIS — Z0000 Encounter for general adult medical examination without abnormal findings: Secondary | ICD-10-CM

## 2013-05-04 DIAGNOSIS — Z125 Encounter for screening for malignant neoplasm of prostate: Secondary | ICD-10-CM

## 2013-05-04 LAB — CBC WITH DIFFERENTIAL/PLATELET
Basophils Absolute: 0 10*3/uL (ref 0.0–0.1)
Eosinophils Relative: 3.6 % (ref 0.0–5.0)
HCT: 45.9 % (ref 39.0–52.0)
Lymphocytes Relative: 38.2 % (ref 12.0–46.0)
Lymphs Abs: 1.5 10*3/uL (ref 0.7–4.0)
Monocytes Relative: 11 % (ref 3.0–12.0)
Neutrophils Relative %: 46.7 % (ref 43.0–77.0)
Platelets: 191 10*3/uL (ref 150.0–400.0)
RDW: 13.2 % (ref 11.5–14.6)
WBC: 4 10*3/uL — ABNORMAL LOW (ref 4.5–10.5)

## 2013-05-04 LAB — BASIC METABOLIC PANEL
Calcium: 9.4 mg/dL (ref 8.4–10.5)
Creatinine, Ser: 1.4 mg/dL (ref 0.4–1.5)
GFR: 65.43 mL/min (ref >60.00)
Sodium: 141 mEq/L (ref 135–145)

## 2013-05-04 LAB — POCT URINALYSIS DIPSTICK
Glucose, UA: NEGATIVE
Leukocytes, UA: NEGATIVE
Protein, UA: NEGATIVE
Spec Grav, UA: 1.025
Urobilinogen, UA: 0.2

## 2013-05-04 LAB — HEPATIC FUNCTION PANEL
Bilirubin, Direct: 0.1 mg/dL (ref 0.0–0.3)
Total Bilirubin: 0.9 mg/dL (ref 0.3–1.2)
Total Protein: 6.8 g/dL (ref 6.0–8.3)

## 2013-05-04 LAB — TSH: TSH: 0.58 u[IU]/mL (ref 0.35–5.50)

## 2013-05-04 LAB — LIPID PANEL
HDL: 60.2 mg/dL (ref >39.00)
Total CHOL/HDL Ratio: 3
Triglycerides: 59 mg/dL (ref 0.0–149.0)
VLDL: 11.8 mg/dL (ref 0.0–40.0)

## 2013-05-04 NOTE — Progress Notes (Signed)
  Subjective:    Patient ID: Peter Bond, male    DOB: 11-28-1945, 67 y.o.   MRN: [id number]  HPI 67 yr old male for a cpx. He feels great.    Review of Systems  Constitutional: Negative.   HENT: Negative.   Eyes: Negative.   Respiratory: Negative.   Cardiovascular: Negative.   Gastrointestinal: Negative.   Genitourinary: Negative.   Musculoskeletal: Negative.   Skin: Negative.   Neurological: Negative.   Psychiatric/Behavioral: Negative.        Objective:   Physical Exam  Constitutional: He is oriented to person, place, and time. He appears well-developed and well-nourished. No distress.  HENT:  Head: Normocephalic and atraumatic.  Right Ear: External ear normal.  Left Ear: External ear normal.  Nose: Nose normal.  Mouth/Throat: Oropharynx is clear and moist. No oropharyngeal exudate.  Eyes: Conjunctivae and EOM are normal. Pupils are equal, round, and reactive to light. Right eye exhibits no discharge. Left eye exhibits no discharge. No scleral icterus.  Neck: Neck supple. No JVD present. No tracheal deviation present. No thyromegaly present.  Cardiovascular: Normal rate, regular rhythm, normal heart sounds and intact distal pulses.  Exam reveals no gallop and no friction rub.   No murmur heard. EKG normal   Pulmonary/Chest: Effort normal and breath sounds normal. No respiratory distress. He has no wheezes. He has no rales. He exhibits no tenderness.  Abdominal: Soft. Bowel sounds are normal. He exhibits no distension and no mass. There is no tenderness. There is no rebound and no guarding.  Genitourinary: Rectum normal, prostate normal and penis normal. Guaiac negative stool. No penile tenderness.  Musculoskeletal: Normal range of motion. He exhibits no edema and no tenderness.  Lymphadenopathy:    He has no cervical adenopathy.  Neurological: He is alert and oriented to person, place, and time. He has normal reflexes. No cranial nerve deficit. He exhibits normal muscle  tone. Coordination normal.  Skin: Skin is warm and dry. No rash noted. He is not diaphoretic. No erythema. No pallor.  Psychiatric: He has a normal mood and affect. His behavior is normal. Judgment and thought content normal.          Assessment & Plan:  Well exam. Get fasting labs

## 2013-05-06 NOTE — Progress Notes (Signed)
Quick Note:  I left voice message with results. ______ 

## 2013-09-15 ENCOUNTER — Ambulatory Visit (INDEPENDENT_AMBULATORY_CARE_PROVIDER_SITE_OTHER)
Admission: RE | Admit: 2013-09-15 | Discharge: 2013-09-15 | Disposition: A | Discharge status: Home / Self Care | Payer: Medicare Other | Source: Ambulatory Visit | Attending: Family Medicine | Admitting: Family Medicine

## 2013-09-15 ENCOUNTER — Encounter: Payer: Self-pay | Admitting: Family Medicine

## 2013-09-15 ENCOUNTER — Ambulatory Visit (INDEPENDENT_AMBULATORY_CARE_PROVIDER_SITE_OTHER): Payer: Medicare Other | Admitting: Family Medicine

## 2013-09-15 VITALS — BP 124/72 | Temp 98.0°F | Ht 66.0 in | Wt 172.0 lb

## 2013-09-15 DIAGNOSIS — S93609A Unspecified sprain of unspecified foot, initial encounter: Secondary | ICD-10-CM

## 2013-09-15 DIAGNOSIS — S93601A Unspecified sprain of right foot, initial encounter: Secondary | ICD-10-CM

## 2013-09-15 DIAGNOSIS — M2140 Flat foot [pes planus] (acquired), unspecified foot: Secondary | ICD-10-CM | POA: Insufficient documentation

## 2013-09-15 NOTE — Progress Notes (Signed)
   Subjective:    Patient ID: Peter Bond, male    DOB: April 02, 1946, 68 y.o.   MRN: [id number]  HPI Here for one month of pain on the right foot after he twisted it one the job. He has a hx of flat arches and he saw Podiatry about 4 years ago for this. They made him a pair of rigid molded supports but he admits to never wearing them. The day of the injury he was standing still and then turned to walk away. He felt a sharp pain in the medial foot which has persisted. No swelling or redness or warmth.    Review of Systems  Constitutional: Negative.   Musculoskeletal: Positive for arthralgias.       Objective:   Physical Exam  Constitutional: He appears well-developed and well-nourished. No distress.  Musculoskeletal:  The right foot has no swelling or erythema. He has very flat arches. He is tender along the medial arch and inferior to the medial malleolus. Full ROM.           Assessment & Plan:  It appears that he has strained some ligaments in the foot, and this was in part due to his flat arches. We will get Xrays today. I urged him to wear the molded inserts inside his shoes every day to give this a chance to heal. He can use heat and Advil prn.

## 2013-09-15 NOTE — Progress Notes (Signed)
Pre visit review using our clinic review tool, if applicable. No additional management support is needed unless otherwise documented below in the visit note. 

## 2013-09-16 ENCOUNTER — Telehealth: Payer: Self-pay | Admitting: Family Medicine

## 2013-09-16 NOTE — Telephone Encounter (Signed)
Pt would like results of xray done 1/28/ pls call

## 2013-09-16 NOTE — Telephone Encounter (Signed)
I spoke with pt  

## 2013-09-16 NOTE — Telephone Encounter (Signed)
This was normal, no fractures seen

## 2013-10-04 ENCOUNTER — Ambulatory Visit (INDEPENDENT_AMBULATORY_CARE_PROVIDER_SITE_OTHER): Payer: Medicare Other

## 2013-10-04 ENCOUNTER — Encounter: Payer: Self-pay | Admitting: Podiatry

## 2013-10-04 ENCOUNTER — Ambulatory Visit (INDEPENDENT_AMBULATORY_CARE_PROVIDER_SITE_OTHER): Payer: Medicare Other | Admitting: Podiatry

## 2013-10-04 VITALS — BP 152/76 | HR 76 | Resp 12

## 2013-10-04 DIAGNOSIS — R52 Pain, unspecified: Secondary | ICD-10-CM

## 2013-10-04 DIAGNOSIS — M7750 Other enthesopathy of unspecified foot: Secondary | ICD-10-CM

## 2013-10-04 MED ORDER — TRIAMCINOLONE ACETONIDE 10 MG/ML IJ SUSP
10.0000 mg | Freq: Once | INTRAMUSCULAR | Status: AC
  Administered 2013-10-04: 10 mg

## 2013-10-04 NOTE — Progress Notes (Signed)
Subjective:     Patient ID: Peter Bond, male   DOB: [birth year], 68 y.o.   MRN: [id number]  Foot Pain   patient presents stating I am having pain on the inside of my right foot of at least one month without any specific injury even though I did twist my foot prior to hurting   Review of Systems  All other systems reviewed and are negative.       Objective:   Physical Exam  Nursing note and vitals reviewed. Constitutional: He is oriented to person, place, and time.  Cardiovascular: Intact distal pulses.   Musculoskeletal: Normal range of motion.  Neurological: He is oriented to person, place, and time.  Skin: Skin is warm.   neurovascular status intact with muscle strength adequate and discomfort and fluid around the insertion of the posterior tibial tendon into the navicular right foot. Muscle strength around this area was found to be within normal limits with no indication of tear and I noted there to be normal Fill time with depression of the arch both feet and moderate structural bunion deformity     Assessment:     Tendinitis of the right foot insertion of posterior tib into the navicular along with flatfoot deformity which is a part of the problem    Plan:     H&P and x-ray reviewed. Careful sheath injection accomplished 3 mg Dexon some Kenalog 5 mg Xylocaine and applied fascially brace to with the arch. Reappoint to recheck again in one week

## 2013-10-04 NOTE — Progress Notes (Signed)
   Subjective:    Patient ID: Peter Bond, male    DOB: [birth year], 68 y.o.   MRN: [id number]  HPI PT STATED RT INSIDE OF THE FOOT IS HURTING AND FEELS LIKE SOMETHING PULLING WHEN WALKING AND THIS BEEN GOING ON FOR 1 MONTH AND TRIED TO WEAR TO INSERTS AND SWITCHING SHOES  BUT IS NOT HELPING.    Review of Systems  All other systems reviewed and are negative.       Objective:   Physical Exam        Assessment & Plan:

## 2013-10-14 ENCOUNTER — Ambulatory Visit: Payer: Medicare Other | Admitting: Podiatry

## 2013-10-20 ENCOUNTER — Ambulatory Visit: Payer: Medicare Other | Admitting: Podiatry

## 2013-10-28 ENCOUNTER — Encounter: Payer: Self-pay | Admitting: Podiatry

## 2013-10-28 ENCOUNTER — Ambulatory Visit (INDEPENDENT_AMBULATORY_CARE_PROVIDER_SITE_OTHER): Payer: Medicare Other | Admitting: Podiatry

## 2013-10-28 VITALS — BP 140/83 | HR 78 | Resp 16

## 2013-10-28 DIAGNOSIS — M7750 Other enthesopathy of unspecified foot: Secondary | ICD-10-CM

## 2013-10-29 NOTE — Progress Notes (Signed)
Subjective:     Patient ID: Peter Bond, male   DOB: 09/22/1945, 68 y.o.   MRN: [id number]  HPI patient is found to have continued discomfort in his right medial arch and foot stating that it is improved from previous but if he is on it for a long time it gets sore. He is wearing his orthotics but at this time because of the pain they are irritating him   Review of Systems     Objective:   Physical Exam Neurovascular status intact with discomfort in the posterior tibial insertion but more plantar and also into medial arch itself    Assessment:     Posterior tibial tendinitis of a more plantar nature and fasciitis of this area    Plan:     Careful second injection administered after discussion of risk with patient consisting of dexamethasone Kenalog and Xylocaine and advised on reduced activity and the possibility for new orthotics if symptoms persist

## 2014-02-02 ENCOUNTER — Ambulatory Visit (INDEPENDENT_AMBULATORY_CARE_PROVIDER_SITE_OTHER): Payer: Medicare Other | Admitting: Podiatry

## 2014-02-02 ENCOUNTER — Encounter: Payer: Self-pay | Admitting: Podiatry

## 2014-02-02 ENCOUNTER — Ambulatory Visit (INDEPENDENT_AMBULATORY_CARE_PROVIDER_SITE_OTHER): Payer: Medicare Other

## 2014-02-02 VITALS — BP 121/80 | HR 85 | Resp 16

## 2014-02-02 DIAGNOSIS — M779 Enthesopathy, unspecified: Secondary | ICD-10-CM

## 2014-02-02 DIAGNOSIS — M7750 Other enthesopathy of unspecified foot: Secondary | ICD-10-CM

## 2014-02-02 MED ORDER — TRIAMCINOLONE ACETONIDE 10 MG/ML IJ SUSP
10.0000 mg | Freq: Once | INTRAMUSCULAR | Status: AC
  Administered 2014-02-02: 10 mg

## 2014-02-02 NOTE — Progress Notes (Signed)
Subjective:     Patient ID: Peter Bond, male   DOB: [birth year], 68 y.o.   MRN: [id number]  HPI patient presents stating the pain in his right foot is still present and seems to hurt more he is on his foot   Review of Systems     Objective:   Physical Exam    neurovascular status intact with pain in the posterior tibial tendon right proximal to its insertion into the navicular. I checked muscle strength and found to be intact so I do believe that some kind of inflammatory condition even though there could be a tear within the tendon. Assessment:     Probable chronic posterior tibial tendinitis with possibility of tear    Plan:     Explained condition and we did x-rays. I've recommended complete immobilization with air fracture walker and long-term orthotics for his shoes to try to support the plantar arch. Patient is dispensed air fracture walker and scanned for custom orthotic devices

## 2014-02-28 ENCOUNTER — Ambulatory Visit: Payer: Medicare Other | Admitting: *Deleted

## 2014-02-28 DIAGNOSIS — M779 Enthesopathy, unspecified: Secondary | ICD-10-CM

## 2014-02-28 NOTE — Patient Instructions (Signed)

## 2014-02-28 NOTE — Progress Notes (Signed)
Pt is here to PUO 

## 2014-05-30 ENCOUNTER — Other Ambulatory Visit: Payer: Self-pay | Admitting: Neurological Surgery

## 2014-05-30 DIAGNOSIS — D352 Benign neoplasm of pituitary gland: Secondary | ICD-10-CM

## 2014-06-06 ENCOUNTER — Ambulatory Visit
Admission: RE | Admit: 2014-06-06 | Discharge: 2014-06-06 | Disposition: A | Discharge status: Home / Self Care | Payer: Medicare Other | Source: Ambulatory Visit | Attending: Neurological Surgery | Admitting: Neurological Surgery

## 2014-06-06 DIAGNOSIS — D352 Benign neoplasm of pituitary gland: Secondary | ICD-10-CM

## 2014-06-06 MED ORDER — GADOBENATE DIMEGLUMINE 529 MG/ML IV SOLN
8.0000 mL | Freq: Once | INTRAVENOUS | Status: AC | PRN
  Administered 2014-06-06: 8 mL via INTRAVENOUS

## 2014-06-29 ENCOUNTER — Ambulatory Visit (INDEPENDENT_AMBULATORY_CARE_PROVIDER_SITE_OTHER): Payer: Medicare Other | Admitting: Family Medicine

## 2014-06-29 ENCOUNTER — Encounter: Payer: Self-pay | Admitting: Family Medicine

## 2014-06-29 ENCOUNTER — Telehealth: Payer: Self-pay | Admitting: Family Medicine

## 2014-06-29 VITALS — BP 128/76 | HR 68 | Temp 98.2°F | Ht 66.0 in | Wt 172.0 lb

## 2014-06-29 DIAGNOSIS — E785 Hyperlipidemia, unspecified: Secondary | ICD-10-CM

## 2014-06-29 DIAGNOSIS — N528 Other male erectile dysfunction: Secondary | ICD-10-CM

## 2014-06-29 DIAGNOSIS — Z0000 Encounter for general adult medical examination without abnormal findings: Secondary | ICD-10-CM

## 2014-06-29 DIAGNOSIS — N529 Male erectile dysfunction, unspecified: Secondary | ICD-10-CM | POA: Insufficient documentation

## 2014-06-29 DIAGNOSIS — Z125 Encounter for screening for malignant neoplasm of prostate: Secondary | ICD-10-CM

## 2014-06-29 LAB — CBC WITH DIFFERENTIAL/PLATELET
BASOS PCT: 0.5 % (ref 0.0–3.0)
Basophils Absolute: 0 10*3/uL (ref 0.0–0.1)
EOS ABS: 0.2 10*3/uL (ref 0.0–0.7)
EOS PCT: 3.9 % (ref 0.0–5.0)
HCT: 46.7 % (ref 39.0–52.0)
HEMOGLOBIN: 15.2 g/dL (ref 13.0–17.0)
LYMPHS PCT: 41 % (ref 12.0–46.0)
Lymphs Abs: 1.6 10*3/uL (ref 0.7–4.0)
MCHC: 32.5 g/dL (ref 30.0–36.0)
MCV: 87 fl (ref 78.0–100.0)
Monocytes Absolute: 0.5 10*3/uL (ref 0.1–1.0)
Monocytes Relative: 12.1 % — ABNORMAL HIGH (ref 3.0–12.0)
NEUTROS ABS: 1.6 10*3/uL (ref 1.4–7.7)
Neutrophils Relative %: 42.5 % — ABNORMAL LOW (ref 43.0–77.0)
Platelets: 203 10*3/uL (ref 150.0–400.0)
RBC: 5.36 Mil/uL (ref 4.22–5.81)
RDW: 13.4 % (ref 11.5–15.5)
WBC: 3.9 10*3/uL — ABNORMAL LOW (ref 4.0–10.5)

## 2014-06-29 LAB — LIPID PANEL
CHOL/HDL RATIO: 4
Cholesterol: 190 mg/dL (ref 0–200)
HDL: 51.5 mg/dL (ref >39.00)
LDL Cholesterol: 122 mg/dL — ABNORMAL HIGH (ref 0–99)
NONHDL: 138.5
Triglycerides: 82 mg/dL (ref 0.0–149.0)
VLDL: 16.4 mg/dL (ref 0.0–40.0)

## 2014-06-29 LAB — HEPATIC FUNCTION PANEL
ALK PHOS: 71 U/L (ref 39–117)
ALT: 26 U/L (ref 0–53)
AST: 23 U/L (ref 0–37)
Albumin: 3.6 g/dL (ref 3.5–5.2)
BILIRUBIN TOTAL: 0.8 mg/dL (ref 0.2–1.2)
Bilirubin, Direct: 0.1 mg/dL (ref 0.0–0.3)
Total Protein: 6.7 g/dL (ref 6.0–8.3)

## 2014-06-29 LAB — TSH: TSH: 0.79 u[IU]/mL (ref 0.35–4.50)

## 2014-06-29 LAB — POCT URINALYSIS DIPSTICK
BILIRUBIN UA: NEGATIVE
Glucose, UA: NEGATIVE
KETONES UA: NEGATIVE
LEUKOCYTES UA: NEGATIVE
Nitrite, UA: NEGATIVE
PH UA: 7
Protein, UA: NEGATIVE
RBC UA: NEGATIVE
Spec Grav, UA: 1.015
Urobilinogen, UA: 0.2

## 2014-06-29 LAB — BASIC METABOLIC PANEL
BUN: 15 mg/dL (ref 6–23)
CHLORIDE: 102 meq/L (ref 96–112)
CO2: 32 mEq/L (ref 19–32)
Calcium: 9.5 mg/dL (ref 8.4–10.5)
Creatinine, Ser: 1.4 mg/dL (ref 0.4–1.5)
GFR: 66.31 mL/min (ref >60.00)
Glucose, Bld: 86 mg/dL (ref 70–99)
POTASSIUM: 4.6 meq/L (ref 3.5–5.1)
SODIUM: 140 meq/L (ref 135–145)

## 2014-06-29 LAB — PSA: PSA: 2.86 ng/mL (ref 0.10–4.00)

## 2014-06-29 MED ORDER — ATORVASTATIN CALCIUM 20 MG PO TABS: ORAL_TABLET | ORAL | Status: DC

## 2014-06-29 MED ORDER — VARDENAFIL HCL 20 MG PO TABS: 20.0000 mg | ORAL_TABLET | Freq: Every day | ORAL | Status: DC | PRN

## 2014-06-29 NOTE — Progress Notes (Signed)
   Subjective:    Patient ID: Peter Bond, male    DOB: 1946/06/12, 68 y.o.   MRN: [id number]  HPI 68 yr old male for a cpx. He feels great. Wearing arch supports from Dr. Paulla Bond and these help his foot pain tremendously. He saw Dr. Isac Bond recently to follow up the resection of his pituitary adenoma and the radiologist read the MRI as showing a possible tumor remnant. However Dr. Ellene Bond disagreed and told Peter Bond not to worry about it. They plan on a follow up MRI in one year. He has had some erection problems and tried some Levitra from a friend. This worked well so he asks for a supply of his own.    Review of Systems  Constitutional: Negative.   HENT: Negative.   Eyes: Negative.   Respiratory: Negative.   Cardiovascular: Negative.   Gastrointestinal: Negative.   Genitourinary: Negative.   Musculoskeletal: Negative.   Skin: Negative.   Neurological: Negative.   Psychiatric/Behavioral: Negative.        Objective:   Physical Exam  Constitutional: He is oriented to person, place, and time. He appears well-developed and well-nourished. No distress.  HENT:  Head: Normocephalic and atraumatic.  Right Ear: External ear normal.  Left Ear: External ear normal.  Nose: Nose normal.  Mouth/Throat: Oropharynx is clear and moist. No oropharyngeal exudate.  Eyes: Conjunctivae and EOM are normal. Pupils are equal, round, and reactive to light. Right eye exhibits no discharge. Left eye exhibits no discharge. No scleral icterus.  Neck: Neck supple. No JVD present. No tracheal deviation present. No thyromegaly present.  Cardiovascular: Normal rate, regular rhythm, normal heart sounds and intact distal pulses.  Exam reveals no gallop and no friction rub.   No murmur heard. EKG stable with LAFB   Pulmonary/Chest: Effort normal and breath sounds normal. No respiratory distress. He has no wheezes. He has no rales. He exhibits no tenderness.  Abdominal: Soft. Bowel sounds are normal. He exhibits no  distension and no mass. There is no tenderness. There is no rebound and no guarding.  Genitourinary: Rectum normal, prostate normal and penis normal. Guaiac negative stool. No penile tenderness.  Musculoskeletal: Normal range of motion. He exhibits no edema or tenderness.  Lymphadenopathy:    He has no cervical adenopathy.  Neurological: He is alert and oriented to person, place, and time. He has normal reflexes. No cranial nerve deficit. He exhibits normal muscle tone. Coordination normal.  Skin: Skin is warm and dry. No rash noted. He is not diaphoretic. No erythema. No pallor.  Psychiatric: He has a normal mood and affect. His behavior is normal. Judgment and thought content normal.          Assessment & Plan:  Well exam. Wrote for Levitra. Get labs today

## 2014-06-29 NOTE — Telephone Encounter (Signed)
I called to submit PA for Levitra and was advised the medication is a Plan Exclusion.  The representative stated that the patient's plan doesn't cover any erectile dysfunction medications.

## 2014-06-29 NOTE — Progress Notes (Signed)
Pre visit review using our clinic review tool, if applicable. No additional management support is needed unless otherwise documented below in the visit note. 

## 2014-07-01 NOTE — Telephone Encounter (Signed)
I left a voice message with the below information.

## 2014-07-15 ENCOUNTER — Encounter: Payer: Medicare Other | Admitting: Family Medicine

## 2014-10-11 DIAGNOSIS — Z961 Presence of intraocular lens: Secondary | ICD-10-CM | POA: Diagnosis not present

## 2014-10-11 DIAGNOSIS — H2511 Age-related nuclear cataract, right eye: Secondary | ICD-10-CM | POA: Diagnosis not present

## 2014-10-11 DIAGNOSIS — H15102 Unspecified episcleritis, left eye: Secondary | ICD-10-CM | POA: Diagnosis not present

## 2014-10-17 DIAGNOSIS — H40053 Ocular hypertension, bilateral: Secondary | ICD-10-CM | POA: Diagnosis not present

## 2014-10-17 DIAGNOSIS — Z961 Presence of intraocular lens: Secondary | ICD-10-CM | POA: Diagnosis not present

## 2014-10-17 DIAGNOSIS — H15102 Unspecified episcleritis, left eye: Secondary | ICD-10-CM | POA: Diagnosis not present

## 2014-10-17 DIAGNOSIS — H2511 Age-related nuclear cataract, right eye: Secondary | ICD-10-CM | POA: Diagnosis not present

## 2014-10-24 DIAGNOSIS — Z961 Presence of intraocular lens: Secondary | ICD-10-CM | POA: Diagnosis not present

## 2014-10-24 DIAGNOSIS — H40053 Ocular hypertension, bilateral: Secondary | ICD-10-CM | POA: Diagnosis not present

## 2014-10-24 DIAGNOSIS — H2511 Age-related nuclear cataract, right eye: Secondary | ICD-10-CM | POA: Diagnosis not present

## 2014-10-24 DIAGNOSIS — H40013 Open angle with borderline findings, low risk, bilateral: Secondary | ICD-10-CM | POA: Diagnosis not present

## 2014-11-03 ENCOUNTER — Telehealth: Payer: Self-pay | Admitting: Family Medicine

## 2014-11-03 NOTE — Telephone Encounter (Signed)
Pt said he does not take the flu shot

## 2014-11-04 NOTE — Telephone Encounter (Signed)
I updated chart.

## 2014-11-16 ENCOUNTER — Telehealth: Payer: Self-pay | Admitting: Family Medicine

## 2014-11-16 NOTE — Telephone Encounter (Signed)
I spoke with pharmacy and pt does have refills left, they will fill script and I spoke with pt.

## 2014-11-16 NOTE — Telephone Encounter (Signed)
Pt request refill vardenafil (LEVITRA) 20 MG tablet   10 tabs  Pt states he never picked up this rx and they no longer have it there. Would like to  Know if you can resend? [facility]/ [address]

## 2015-02-14 ENCOUNTER — Encounter: Payer: Self-pay | Admitting: Family Medicine

## 2015-02-14 ENCOUNTER — Ambulatory Visit (INDEPENDENT_AMBULATORY_CARE_PROVIDER_SITE_OTHER): Payer: Medicare Other | Admitting: Family Medicine

## 2015-02-14 VITALS — BP 120/72 | HR 74 | Temp 98.1°F | Wt 175.0 lb

## 2015-02-14 DIAGNOSIS — W57XXXA Bitten or stung by nonvenomous insect and other nonvenomous arthropods, initial encounter: Secondary | ICD-10-CM | POA: Diagnosis not present

## 2015-02-14 DIAGNOSIS — T148 Other injury of unspecified body region: Secondary | ICD-10-CM | POA: Diagnosis not present

## 2015-02-14 NOTE — Patient Instructions (Signed)
Removed tick today  Watch out for signs of illness like a new rash or flu like symptoms. Doubt any of that will happen. Return to see [place] if it does  Suspect you got this from your dog. Tick collar may help.   Tick Bite Information Ticks are insects that attach themselves to the skin and draw blood for food. There are various types of ticks. Common types include wood ticks and deer ticks. Most ticks live in shrubs and grassy areas. Ticks can climb onto your body when you make contact with leaves or grass where the tick is waiting. The most common places on the body for ticks to attach themselves are the scalp, neck, armpits, waist, and groin. Most tick bites are harmless, but sometimes ticks carry germs that cause diseases. These germs can be spread to a person during the tick's feeding process. The chance of a disease spreading through a tick bite depends on:   The type of tick.  Time of year.   How long the tick is attached.   Geographic location.  HOW CAN YOU PREVENT TICK BITES? Take these steps to help prevent tick bites when you are outdoors:  Wear protective clothing. Long sleeves and long pants are best.   Wear white clothes so you can see ticks more easily.  Tuck your pant legs into your socks.   If walking on a trail, stay in the middle of the trail to avoid brushing against bushes.  Avoid walking through areas with long grass.  Put insect repellent on all exposed skin and along boot tops, pant legs, and sleeve cuffs.   Check clothing, hair, and skin repeatedly and before going inside.   Brush off any ticks that are not attached.  Take a shower or bath as soon as possible after being outdoors.  WHAT IS THE PROPER WAY TO REMOVE A TICK? Ticks should be removed as soon as possible to help prevent diseases caused by tick bites. 1. If latex gloves are available, put them on before trying to remove a tick.  2. Using fine-point tweezers, grasp the tick as close to  the skin as possible. You may also use curved forceps or a tick removal tool. Grasp the tick as close to its head as possible. Avoid grasping the tick on its body. 3. Pull gently with steady upward pressure until the tick lets go. Do not twist the tick or jerk it suddenly. This may break off the tick's head or mouth parts. 4. Do not squeeze or crush the tick's body. This could force disease-carrying fluids from the tick into your body.  5. After the tick is removed, wash the bite area and your hands with soap and water or other disinfectant such as alcohol. 6. Apply a small amount of antiseptic cream or ointment to the bite site.  7. Wash and disinfect any instruments that were used.  Do not try to remove a tick by applying a hot match, petroleum jelly, or fingernail polish to the tick. These methods do not work and may increase the chances of disease being spread from the tick bite.  WHEN SHOULD YOU SEEK MEDICAL CARE? Contact your health care provider if you are unable to remove a tick from your skin or if a part of the tick breaks off and is stuck in the skin.  After a tick bite, you need to be aware of signs and symptoms that could be related to diseases spread by ticks. Contact your health care  provider if you develop any of the following in the days or weeks after the tick bite:  Unexplained fever.  Rash. A circular rash that appears days or weeks after the tick bite may indicate the possibility of Lyme disease. The rash may resemble a target with a bull's-eye and may occur at a different part of your body than the tick bite.  Redness and swelling in the area of the tick bite.   Tender, swollen lymph glands.   Diarrhea.   Weight loss.   Cough.   Fatigue.   Muscle, joint, or bone pain.   Abdominal pain.   Headache.   Lethargy or a change in your level of consciousness.  Difficulty walking or moving your legs.   Numbness in the legs.   Paralysis.  Shortness  of breath.   Confusion.   Repeated vomiting.  Document Released: 08/02/2000 Document Revised: 05/26/2013 Document Reviewed: 01/13/2013 [facility] Patient Information 2015 [place], [place]. This information is not intended to replace advice given to you by your health care provider. Make sure you discuss any questions you have with your health care provider.

## 2015-02-14 NOTE — Progress Notes (Signed)
Garret Reddish, MD  Subjective:  Peter Bond is a 69 y.o. year old very pleasant male patient who presents with:  Tick Bite -Working in his yard last week. Dog has had a few ticks as well from being outdoors but is an indoor dog. Last night, noted a small area on his back. Wife thought it was a tick or bug but did not want to remove it. Patient has very mild irritation,slight itch in the area but minimal to no pain. No worsening symptoms. No treatments tried. Very very slight headache but otherwise astymptomatic  ROS- no fever, chills, nausea, vomiting, abnormal fatigue  Past Medical History- hyperlipidemia, erectile dysfunction  Medications- reviewed and updated Current Outpatient Prescriptions  Medication Sig Dispense Refill  . aspirin 325 MG tablet Take 325 mg by mouth daily.      Marland Kitchen atorvastatin (LIPITOR) 20 MG tablet take 1 tablet by mouth once daily 90 tablet 3  . cyclobenzaprine (FLEXERIL) 5 MG tablet Take 1 tablet (5 mg total) by mouth at bedtime as needed for muscle spasms. 60 tablet 0  . ibuprofen (ADVIL,MOTRIN) 600 MG tablet Take 1 tablet (600 mg total) by mouth every 8 (eight) hours as needed for pain. 60 tablet 1  . Multiple Vitamin (MULTIVITAMIN) tablet Take 1 tablet by mouth daily.      . vardenafil (LEVITRA) 20 MG tablet Take 1 tablet (20 mg total) by mouth daily as needed for erectile dysfunction. 10 tablet 11   Objective: BP 120/72 mmHg  Pulse 74  Temp(Src) 98.1 F (36.7 C)  Wt 175 lb (79.379 kg) Gen: NAD, resting comfortably CV: RRR Lungs: nonlabored Ext: no edema, no rash or other ticks found Skin: warm, dry There is a small non-engorged tick on right low back, mild surrounding erythema  Assessment/Plan:  Tick Bite Removed today. No systemic symptoms except very mild headache. Doubt RMSF or Lyme disease but gave return precautions. Advised tick collar for dogs- suspect that is where he got the tick from.

## 2015-03-10 ENCOUNTER — Encounter: Payer: Self-pay | Admitting: Gastroenterology

## 2015-03-13 ENCOUNTER — Encounter: Payer: Self-pay | Admitting: Gastroenterology

## 2015-03-21 ENCOUNTER — Encounter: Payer: Self-pay | Admitting: Gastroenterology

## 2015-04-26 DIAGNOSIS — H40053 Ocular hypertension, bilateral: Secondary | ICD-10-CM | POA: Diagnosis not present

## 2015-04-26 DIAGNOSIS — Z961 Presence of intraocular lens: Secondary | ICD-10-CM | POA: Diagnosis not present

## 2015-04-26 DIAGNOSIS — H2511 Age-related nuclear cataract, right eye: Secondary | ICD-10-CM | POA: Diagnosis not present

## 2015-05-22 ENCOUNTER — Ambulatory Visit (AMBULATORY_SURGERY_CENTER): Payer: Self-pay | Admitting: *Deleted

## 2015-05-22 VITALS — Ht 68.0 in | Wt 170.2 lb

## 2015-05-22 DIAGNOSIS — Z1211 Encounter for screening for malignant neoplasm of colon: Secondary | ICD-10-CM

## 2015-05-22 NOTE — Progress Notes (Signed)
No home 02 use No diet pills No egg or soy allergy No issues with past sedation 

## 2015-06-05 ENCOUNTER — Encounter: Payer: Medicare Other | Admitting: Gastroenterology

## 2015-06-07 DIAGNOSIS — H2511 Age-related nuclear cataract, right eye: Secondary | ICD-10-CM | POA: Diagnosis not present

## 2015-06-07 DIAGNOSIS — Z961 Presence of intraocular lens: Secondary | ICD-10-CM | POA: Diagnosis not present

## 2015-06-07 DIAGNOSIS — H40053 Ocular hypertension, bilateral: Secondary | ICD-10-CM | POA: Diagnosis not present

## 2015-06-19 ENCOUNTER — Ambulatory Visit (AMBULATORY_SURGERY_CENTER): Payer: Medicare Other | Admitting: Gastroenterology

## 2015-06-19 ENCOUNTER — Encounter: Payer: Self-pay | Admitting: Gastroenterology

## 2015-06-19 VITALS — BP 112/66 | HR 59 | Temp 96.2°F | Resp 13 | Ht 68.0 in | Wt 170.0 lb

## 2015-06-19 DIAGNOSIS — Z1211 Encounter for screening for malignant neoplasm of colon: Secondary | ICD-10-CM | POA: Diagnosis present

## 2015-06-19 DIAGNOSIS — D120 Benign neoplasm of cecum: Secondary | ICD-10-CM

## 2015-06-19 HISTORY — PX: COLONOSCOPY: SHX174

## 2015-06-19 MED ORDER — SODIUM CHLORIDE 0.9 % IV SOLN: 500.0000 mL | INTRAVENOUS | Status: DC

## 2015-06-19 NOTE — Progress Notes (Signed)
Called to room to assist during endoscopic procedure.  Patient ID and intended procedure confirmed with present staff. Received instructions for my participation in the procedure from the performing physician.  

## 2015-06-19 NOTE — Op Note (Signed)
[facility]  [address]. [place], [address]   COLONOSCOPY PROCEDURE REPORT  PATIENT: Elva, Breaker  MR#: [id number] BIRTHDATE: [birth year] , 69  yrs. old GENDER: male ENDOSCOPIST: Yetta Flock, MD REFERRED BY: Alysia Penna MD PROCEDURE DATE:  06/19/2015 PROCEDURE:   Colonoscopy, screening and Colonoscopy with snare polypectomy First Screening Colonoscopy - Avg.  risk and is 50 yrs.  old or older - No.  Prior Negative Screening - Now for repeat screening. 10 or more years since last screening  History of Adenoma - Now for follow-up colonoscopy & has been > or = to 3 yrs.  N/A  Polyps removed today? Yes ASA CLASS:   Class II INDICATIONS:Screening for colonic neoplasia and Colorectal Neoplasm Risk Assessment for this procedure is average risk. MEDICATIONS: Propofol 350 mg IV  DESCRIPTION OF PROCEDURE:   After the risks benefits and alternatives of the procedure were thoroughly explained, informed consent was obtained.  The digital rectal exam revealed no abnormalities of the rectum.   The LB [id number]  endoscope was introduced through the anus and advanced to the cecum, which was identified by both the appendix and ileocecal valve. No adverse events experienced.   The quality of the prep was adequate  The instrument was then slowly withdrawn as the colon was fully examined. Estimated blood loss is zero unless otherwise noted in this procedure report.   COLON FINDINGS: A sessile polyp measuring 7 mm in size was found at the cecum.  A polypectomy was performed with a cold snare.  The resection was complete, the polyp tissue was completely retrieved and sent to histology.   There was mild diverticulosis noted in the sigmoid colon.   The examination was otherwise normal.  Retroflexed views revealed no abnormalities. The time to cecum = 4.2 Withdrawal time = 16.1   The scope was withdrawn and the procedure completed. COMPLICATIONS: There were no  immediate complications.  ENDOSCOPIC IMPRESSION: 1.   Sessile polyp was found at the cecum; polypectomy was performed with a cold snare 2.   Mild diverticulosis was noted in the sigmoid colon 3.   The examination was otherwise normal  RECOMMENDATIONS: 1.  Hold Aspirin and all other NSAIDS for 2 weeks. 2.  Await pathology results 3.  Resume diet 3.  Resume medications  eSigned:  Yetta Flock, MD 06/19/2015 3:45 PM   cc: Laurey Morale, MD

## 2015-06-19 NOTE — Patient Instructions (Signed)
YOU HAD AN ENDOSCOPIC PROCEDURE TODAY AT [facility]:   Refer to the procedure report that was given to you for any specific questions about what was found during the examination.  If the procedure report does not answer your questions, please call your gastroenterologist to clarify.  If you requested that your care partner not be given the details of your procedure findings, then the procedure report has been included in a sealed envelope for you to review at your convenience later.  YOU SHOULD EXPECT: Some feelings of bloating in the abdomen. Passage of more gas than usual.  Walking can help get rid of the air that was put into your GI tract during the procedure and reduce the bloating. If you had a lower endoscopy (such as a colonoscopy or flexible sigmoidoscopy) you may notice spotting of blood in your stool or on the toilet paper. If you underwent a bowel prep for your procedure, you may not have a normal bowel movement for a few days.  Please Note:  You might notice some irritation and congestion in your nose or some drainage.  This is from the oxygen used during your procedure.  There is no need for concern and it should clear up in a day or so.  SYMPTOMS TO REPORT IMMEDIATELY:   Following lower endoscopy (colonoscopy or flexible sigmoidoscopy):  Excessive amounts of blood in the stool  Significant tenderness or worsening of abdominal pains  Swelling of the abdomen that is new, acute  Fever of 100F or higher  For urgent or emergent issues, a gastroenterologist can be reached at any hour by calling [phone].   DIET: Your first meal following the procedure should be a small meal and then it is ok to progress to your normal diet. Heavy or fried foods are harder to digest and may make you feel nauseous or bloated.  Likewise, meals heavy in dairy and vegetables can increase bloating.  Drink plenty of fluids but you should avoid alcoholic beverages for 24 hours. Try to  increase the fiber in your diet.  ACTIVITY:  You should plan to take it easy for the rest of today and you should NOT DRIVE or use heavy machinery until tomorrow (because of the sedation medicines used during the test).    FOLLOW UP: Our staff will call the number listed on your records the next business day following your procedure to check on you and address any questions or concerns that you may have regarding the information given to you following your procedure. If we do not reach you, we will leave a message.  However, if you are feeling well and you are not experiencing any problems, there is no need to return our call.  We will assume that you have returned to your regular daily activities without incident.  If any biopsies were taken you will be contacted by phone or by letter within the next 1-3 weeks.  Please call us at [phone] if you have not heard about the biopsies in 3 weeks.    SIGNATURES/CONFIDENTIALITY: You and/or your care partner have signed paperwork which will be entered into your electronic medical record.  These signatures attest to the fact that that the information above on your After Visit Summary has been reviewed and is understood.  Full responsibility of the confidentiality of this discharge information lies with you and/or your care-partner.  NO NSAIDS(iBUPROFEN, ALEVE AND ASPIRIN) FOR TWO WEEKS PER DR. A.

## 2015-06-19 NOTE — Progress Notes (Signed)
Transferred to recovery room. A/O x3, pleased with MAC.  VSS.  Report to Suzanne, RN. 

## 2015-06-20 ENCOUNTER — Telehealth: Payer: Self-pay | Admitting: Gastroenterology

## 2015-06-20 ENCOUNTER — Telehealth: Payer: Self-pay | Admitting: *Deleted

## 2015-06-20 NOTE — Telephone Encounter (Signed)
Patient requested another copy of report. Mailed a copy to him.

## 2015-06-20 NOTE — Telephone Encounter (Signed)
  Follow up Call-  Call back number 06/19/2015  Post procedure Call Back phone  # [phone] cell  Permission to leave phone message Yes     Patient questions:  Do you have a fever, pain , or abdominal swelling? No. Pain Score  0 *  Have you tolerated food without any problems? Yes.    Have you been able to return to your normal activities? Yes.    Do you have any questions about your discharge instructions: Diet   No. Medications  No. Follow up visit  No.  Do you have questions or concerns about your Care? No.  Actions: * If pain score is 4 or above: No action needed, pain <4.

## 2015-06-25 ENCOUNTER — Encounter: Payer: Self-pay | Admitting: Gastroenterology

## 2015-06-29 ENCOUNTER — Telehealth: Payer: Self-pay

## 2015-06-29 NOTE — Telephone Encounter (Signed)
Patient requesting colonoscopy results, has not received letter mailed out November 7th.  I read him what the letter said and told him hopefully it will come soon in his mail.

## 2015-08-10 ENCOUNTER — Ambulatory Visit (INDEPENDENT_AMBULATORY_CARE_PROVIDER_SITE_OTHER): Payer: Medicare Other | Admitting: Family Medicine

## 2015-08-10 ENCOUNTER — Encounter: Payer: Self-pay | Admitting: Family Medicine

## 2015-08-10 VITALS — BP 131/83 | HR 66 | Temp 98.0°F | Ht 68.0 in | Wt 168.0 lb

## 2015-08-10 DIAGNOSIS — Z125 Encounter for screening for malignant neoplasm of prostate: Secondary | ICD-10-CM

## 2015-08-10 DIAGNOSIS — Z0000 Encounter for general adult medical examination without abnormal findings: Secondary | ICD-10-CM | POA: Diagnosis not present

## 2015-08-10 DIAGNOSIS — E785 Hyperlipidemia, unspecified: Secondary | ICD-10-CM | POA: Diagnosis not present

## 2015-08-10 LAB — HEPATIC FUNCTION PANEL
ALBUMIN: 4.6 g/dL (ref 3.5–5.2)
ALT: 22 U/L (ref 0–53)
AST: 19 U/L (ref 0–37)
Alkaline Phosphatase: 87 U/L (ref 39–117)
BILIRUBIN DIRECT: 0.1 mg/dL (ref 0.0–0.3)
BILIRUBIN TOTAL: 0.7 mg/dL (ref 0.2–1.2)
TOTAL PROTEIN: 7.2 g/dL (ref 6.0–8.3)

## 2015-08-10 LAB — CBC WITH DIFFERENTIAL/PLATELET
BASOS ABS: 0 10*3/uL (ref 0.0–0.1)
Basophils Relative: 0.6 % (ref 0.0–3.0)
Eosinophils Absolute: 0.1 10*3/uL (ref 0.0–0.7)
Eosinophils Relative: 3.2 % (ref 0.0–5.0)
HCT: 49.8 % (ref 39.0–52.0)
Hemoglobin: 16 g/dL (ref 13.0–17.0)
LYMPHS ABS: 1.6 10*3/uL (ref 0.7–4.0)
Lymphocytes Relative: 38.3 % (ref 12.0–46.0)
MCHC: 32.1 g/dL (ref 30.0–36.0)
MCV: 87.5 fl (ref 78.0–100.0)
MONO ABS: 0.5 10*3/uL (ref 0.1–1.0)
Monocytes Relative: 11.2 % (ref 3.0–12.0)
NEUTROS PCT: 46.7 % (ref 43.0–77.0)
Neutro Abs: 2 10*3/uL (ref 1.4–7.7)
Platelets: 195 10*3/uL (ref 150.0–400.0)
RBC: 5.69 Mil/uL (ref 4.22–5.81)
RDW: 13.7 % (ref 11.5–15.5)
WBC: 4.3 10*3/uL (ref 4.0–10.5)

## 2015-08-10 LAB — LIPID PANEL
CHOL/HDL RATIO: 3
Cholesterol: 216 mg/dL — ABNORMAL HIGH (ref 0–200)
HDL: 63.2 mg/dL (ref >39.00)
LDL CALC: 135 mg/dL — AB (ref 0–99)
NONHDL: 152.87
Triglycerides: 87 mg/dL (ref 0.0–149.0)
VLDL: 17.4 mg/dL (ref 0.0–40.0)

## 2015-08-10 LAB — BASIC METABOLIC PANEL
BUN: 16 mg/dL (ref 6–23)
CALCIUM: 9.6 mg/dL (ref 8.4–10.5)
CO2: 35 mEq/L — ABNORMAL HIGH (ref 19–32)
Chloride: 101 mEq/L (ref 96–112)
Creatinine, Ser: 1.34 mg/dL (ref 0.40–1.50)
GFR: 67.8 mL/min (ref >60.00)
GLUCOSE: 80 mg/dL (ref 70–99)
Potassium: 4.9 mEq/L (ref 3.5–5.1)
SODIUM: 141 meq/L (ref 135–145)

## 2015-08-10 LAB — POCT URINALYSIS DIPSTICK
Bilirubin, UA: NEGATIVE
Glucose, UA: NEGATIVE
Ketones, UA: NEGATIVE
Leukocytes, UA: NEGATIVE
Nitrite, UA: NEGATIVE
PROTEIN UA: NEGATIVE
RBC UA: NEGATIVE
SPEC GRAV UA: 1.02
UROBILINOGEN UA: 0.2
pH, UA: 7

## 2015-08-10 LAB — PSA: PSA: 3.19 ng/mL (ref 0.10–4.00)

## 2015-08-10 LAB — TSH: TSH: 0.72 u[IU]/mL (ref 0.35–4.50)

## 2015-08-10 NOTE — Progress Notes (Signed)
   Subjective:    Patient ID: Peter Bond, male    DOB: [birth year], 69 y.o.   MRN: [id number]  HPI 69 yr old male for a cpx. He feels well and has no concerns.    Review of Systems  Constitutional: Negative.   HENT: Negative.   Eyes: Negative.   Respiratory: Negative.   Cardiovascular: Negative.   Gastrointestinal: Negative.   Genitourinary: Negative.   Musculoskeletal: Negative.   Skin: Negative.   Neurological: Negative.   Psychiatric/Behavioral: Negative.        Objective:   Physical Exam  Constitutional: He is oriented to person, place, and time. He appears well-developed and well-nourished. No distress.  HENT:  Head: Normocephalic and atraumatic.  Right Ear: External ear normal.  Left Ear: External ear normal.  Nose: Nose normal.  Mouth/Throat: Oropharynx is clear and moist. No oropharyngeal exudate.  Eyes: Conjunctivae and EOM are normal. Pupils are equal, round, and reactive to light. Right eye exhibits no discharge. Left eye exhibits no discharge. No scleral icterus.  Neck: Neck supple. No JVD present. No tracheal deviation present. No thyromegaly present.  Cardiovascular: Normal rate, regular rhythm, normal heart sounds and intact distal pulses.  Exam reveals no gallop and no friction rub.   No murmur heard. EKG normal   Pulmonary/Chest: Effort normal and breath sounds normal. No respiratory distress. He has no wheezes. He has no rales. He exhibits no tenderness.  Abdominal: Soft. Bowel sounds are normal. He exhibits no distension and no mass. There is no tenderness. There is no rebound and no guarding.  Genitourinary: Rectum normal, prostate normal and penis normal. Guaiac negative stool. No penile tenderness.  Musculoskeletal: Normal range of motion. He exhibits no edema or tenderness.  Lymphadenopathy:    He has no cervical adenopathy.  Neurological: He is alert and oriented to person, place, and time. He has normal reflexes. No cranial nerve deficit. He  exhibits normal muscle tone. Coordination normal.  Skin: Skin is warm and dry. No rash noted. He is not diaphoretic. No erythema. No pallor.  Psychiatric: He has a normal mood and affect. His behavior is normal. Judgment and thought content normal.          Assessment & Plan:  Well exam. We discussed diet and exercise. Get fasting labs.

## 2015-08-10 NOTE — Progress Notes (Signed)
Pre visit review using our clinic review tool, if applicable. No additional management support is needed unless otherwise documented below in the visit note. 

## 2015-08-15 ENCOUNTER — Telehealth: Payer: Self-pay | Admitting: Family Medicine

## 2015-08-15 NOTE — Telephone Encounter (Signed)
Please see message and advise 

## 2015-08-15 NOTE — Telephone Encounter (Addendum)
Pt was seen on 12-22 and needs a rx for vertigo call into [facility] and generic crestor 20 mg #90 w/refills  and also blood work results.

## 2015-08-16 MED ORDER — MECLIZINE HCL 25 MG PO TABS: 25.0000 mg | ORAL_TABLET | Freq: Four times a day (QID) | ORAL | Status: DC | PRN

## 2015-08-16 MED ORDER — ROSUVASTATIN CALCIUM 20 MG PO TABS: 20.0000 mg | ORAL_TABLET | Freq: Every day | ORAL | Status: DC

## 2015-08-16 NOTE — Telephone Encounter (Signed)
See the result note for the labs. Call in Meclizine 25 mg every 4 hours prn dizziness, #60 with 5 rf. Also refill Crestor for one year

## 2015-08-16 NOTE — Telephone Encounter (Signed)
Rx was sent to the pharmacy. Also pt is aware

## 2015-08-30 ENCOUNTER — Telehealth: Payer: Self-pay | Admitting: Family Medicine

## 2015-08-30 MED ORDER — ATORVASTATIN CALCIUM 20 MG PO TABS: ORAL_TABLET | ORAL | Status: DC

## 2015-08-30 MED ORDER — ROSUVASTATIN CALCIUM 20 MG PO TABS: 20.0000 mg | ORAL_TABLET | Freq: Every day | ORAL | Status: DC

## 2015-08-30 NOTE — Telephone Encounter (Signed)
Rx was sent to the pharmacy  

## 2015-08-30 NOTE — Telephone Encounter (Signed)
Pt calling to request why his atorvastatin (LIPITOR) 20 MG tablet was changed to rosuvastatin (CRESTOR) 20 MG tablet.  Patient states he realized this when he picked it up from the pharmacy.  He has not taken the new medication yet.

## 2015-09-05 ENCOUNTER — Telehealth: Payer: Self-pay

## 2015-09-05 NOTE — Telephone Encounter (Signed)
There were a miss understanding in regarding the Crestor and Lipitor. Spoke to pt and pharmacy and they are aware of the changes

## 2015-09-05 NOTE — Telephone Encounter (Signed)
Pt is aware of the changes also is the pharmacy pt will continue taking the Lipitor

## 2015-09-05 NOTE — Telephone Encounter (Signed)
Peter Bond please patient

## 2016-02-15 DIAGNOSIS — H2511 Age-related nuclear cataract, right eye: Secondary | ICD-10-CM | POA: Diagnosis not present

## 2016-02-15 DIAGNOSIS — H11013 Amyloid pterygium of eye, bilateral: Secondary | ICD-10-CM | POA: Diagnosis not present

## 2016-02-15 DIAGNOSIS — H40053 Ocular hypertension, bilateral: Secondary | ICD-10-CM | POA: Diagnosis not present

## 2016-02-15 DIAGNOSIS — Z961 Presence of intraocular lens: Secondary | ICD-10-CM | POA: Diagnosis not present

## 2016-03-28 DIAGNOSIS — H40053 Ocular hypertension, bilateral: Secondary | ICD-10-CM | POA: Diagnosis not present

## 2016-05-15 DIAGNOSIS — H40053 Ocular hypertension, bilateral: Secondary | ICD-10-CM | POA: Diagnosis not present

## 2016-05-16 ENCOUNTER — Other Ambulatory Visit: Payer: Self-pay | Admitting: Neurological Surgery

## 2016-05-16 DIAGNOSIS — D352 Benign neoplasm of pituitary gland: Secondary | ICD-10-CM

## 2016-05-29 ENCOUNTER — Ambulatory Visit
Admission: RE | Admit: 2016-05-29 | Discharge: 2016-05-29 | Disposition: A | Discharge status: Home / Self Care | Payer: Medicare Other | Source: Ambulatory Visit | Attending: Neurological Surgery | Admitting: Neurological Surgery

## 2016-05-29 DIAGNOSIS — D352 Benign neoplasm of pituitary gland: Secondary | ICD-10-CM

## 2016-08-08 ENCOUNTER — Telehealth: Payer: Self-pay

## 2016-08-08 NOTE — Telephone Encounter (Signed)
Call to Peter Bond Coming in to see Dr. Sarajane Jews at [facility] Agreed to stay post visit for AWV, but will try to work on pre assessment prior to the visit to save time, as he will need to get back to work

## 2016-08-09 ENCOUNTER — Ambulatory Visit (INDEPENDENT_AMBULATORY_CARE_PROVIDER_SITE_OTHER): Payer: Medicare Other | Admitting: Family Medicine

## 2016-08-09 VITALS — BP 118/80 | HR 58 | Temp 98.0°F | Ht 68.0 in | Wt 169.0 lb

## 2016-08-09 DIAGNOSIS — Z0000 Encounter for general adult medical examination without abnormal findings: Secondary | ICD-10-CM | POA: Diagnosis not present

## 2016-08-09 LAB — HEPATIC FUNCTION PANEL
ALBUMIN: 4.5 g/dL (ref 3.5–5.2)
ALT: 19 U/L (ref 0–53)
AST: 18 U/L (ref 0–37)
Alkaline Phosphatase: 69 U/L (ref 39–117)
BILIRUBIN TOTAL: 0.9 mg/dL (ref 0.2–1.2)
Bilirubin, Direct: 0.1 mg/dL (ref 0.0–0.3)
Total Protein: 6.8 g/dL (ref 6.0–8.3)

## 2016-08-09 LAB — CBC WITH DIFFERENTIAL/PLATELET
BASOS ABS: 0 10*3/uL (ref 0.0–0.1)
Basophils Relative: 0.7 % (ref 0.0–3.0)
EOS PCT: 3.2 % (ref 0.0–5.0)
Eosinophils Absolute: 0.1 10*3/uL (ref 0.0–0.7)
HCT: 45.6 % (ref 39.0–52.0)
HEMOGLOBIN: 15.4 g/dL (ref 13.0–17.0)
Lymphocytes Relative: 42.7 % (ref 12.0–46.0)
Lymphs Abs: 1.7 10*3/uL (ref 0.7–4.0)
MCHC: 33.9 g/dL (ref 30.0–36.0)
MCV: 85.9 fl (ref 78.0–100.0)
MONO ABS: 0.5 10*3/uL (ref 0.1–1.0)
MONOS PCT: 12.1 % — AB (ref 3.0–12.0)
NEUTROS PCT: 41.3 % — AB (ref 43.0–77.0)
Neutro Abs: 1.6 10*3/uL (ref 1.4–7.7)
Platelets: 198 10*3/uL (ref 150.0–400.0)
RBC: 5.31 Mil/uL (ref 4.22–5.81)
RDW: 13.6 % (ref 11.5–15.5)
WBC: 3.9 10*3/uL — AB (ref 4.0–10.5)

## 2016-08-09 LAB — LIPID PANEL
CHOL/HDL RATIO: 3
Cholesterol: 178 mg/dL (ref 0–200)
HDL: 55.5 mg/dL (ref >39.00)
LDL CALC: 108 mg/dL — AB (ref 0–99)
NONHDL: 122.31
Triglycerides: 70 mg/dL (ref 0.0–149.0)
VLDL: 14 mg/dL (ref 0.0–40.0)

## 2016-08-09 LAB — BASIC METABOLIC PANEL
BUN: 15 mg/dL (ref 6–23)
CALCIUM: 9.4 mg/dL (ref 8.4–10.5)
CO2: 32 mEq/L (ref 19–32)
CREATININE: 1.36 mg/dL (ref 0.40–1.50)
Chloride: 103 mEq/L (ref 96–112)
GFR: 66.46 mL/min (ref >60.00)
Glucose, Bld: 86 mg/dL (ref 70–99)
Potassium: 4.8 mEq/L (ref 3.5–5.1)
Sodium: 141 mEq/L (ref 135–145)

## 2016-08-09 LAB — PSA: PSA: 3.57 ng/mL (ref 0.10–4.00)

## 2016-08-09 LAB — TSH: TSH: 0.64 u[IU]/mL (ref 0.35–4.50)

## 2016-08-09 MED ORDER — ATORVASTATIN CALCIUM 20 MG PO TABS: ORAL_TABLET | ORAL | 3 refills | Status: DC

## 2016-08-09 NOTE — Progress Notes (Signed)
Pre visit review using our clinic review tool, if applicable. No additional management support is needed unless otherwise documented below in the visit note. 

## 2016-08-09 NOTE — Progress Notes (Addendum)
Subjective:   Peter Bond is a 70 y.o. male who presents for Medicare Annual/Subsequent preventive examination.  The Patient was informed that the wellness visit is to identify future health risk and educate and initiate measures that can reduce risk for increased disease through the lifespan.    NO ROS; Medicare Wellness Visit  Describes health as good, fair or great? Good  See HRA completed by the patient for Health risk   Preventive Screening -Counseling & Management  Colonoscopy 06/19/2015; due in 5 years 05/2020  Smoking history no hx Second Hand Smoke status; No Smokers in the home  ETOH ; YES but rare  RISK FACTORS Regular exercise  Exercise 45 minutes either at home prior to work or in the cardiac rehab at [place] long as he did work there;  Marketing executive to stay in shape  Diet:  BMI normal at 25.7    Fall risk no  Mobility of Functional changes this year? no Safety; stairs in home; has smoke alarms and Carbon monoxide detectors  Drives; no accidents Takes his own meds   Completely Independent in IADL    Cardiac Risk Factors:  Advanced aged > 36 in men; >65 in women Hyperlipidemia reviewed and educated on labs; LDL  Diabetes neg Family History - HD dating back to grandfather  Obesity neg  Eye exams; yes; see Dr. Clent Jacks  Depression Screen PhQ 2: negative  Activities of Daily Living - See functional screen   Cognitive testing; Ad8 score; 0 or less than 2  MMSE deferred or completed if AD8 + 2 issues  Advanced Directives does have an advanced directive [place].   Patient Care Team: Laurey Morale, MD as PCP - General   Immunization History  Administered Date(s) Administered  . Td 08/20/1999, 10/23/2009   Required Immunizations needed today  Screening test up to date or reviewed for plan of completion Health Maintenance Due  Topic Date Due  . Hepatitis C Screening  21-Nov-1945  . ZOSTAVAX  09/30/2005  . PNA vac Low Risk  Adult (1 of 2 - PCV13) 09/30/2010  . INFLUENZA VACCINE  03/19/2016     Agreed to have hep c next blood draw  Will consider the new shingles vaccination next year and given information on checking benefit  Declines the flu vaccine  Agrees to take the prevnar; but has a dinner to go to etc and preferred to wait. Informed he can make an apt with nurse and come in anytime to take it/   Cardiac Risk Factors include: advanced age (>72men, >61 women);dyslipidemia;male gender     Objective:    Vitals: BP 118/80   Pulse (!) 58   Temp 98 F (36.7 C)   Ht 5\' 8"  (1.727 m)   Wt 169 lb (76.7 kg)   BMI 25.70 kg/m   Body mass index is 25.7 kg/m.  Tobacco History  Smoking Status  . Never Smoker  Smokeless Tobacco  . Never Used     Counseling given: Not Answered   Past Medical History:  Diagnosis Date  . Cataract    1980's cataract removed  . GERD (gastroesophageal reflux disease)   . Glaucoma    left eye   . Hyperlipidemia    Past Surgical History:  Procedure Laterality Date  . CATARACT EXTRACTION     1980's  . COLONOSCOPY  06-19-15   per Dr. Havery Moros, adenomatous polyp and sigmoid diverticulosis, repeat in 5 yrs    . corneal implant    .  PITUITARY EXCISION     pituitary gland removal   Family History  Problem Relation Age of Onset  . Prostate cancer Brother   . Stroke    . Prostate cancer Brother   . Colon cancer Neg Hx   . Esophageal cancer Neg Hx   . Rectal cancer Neg Hx   . Stomach cancer Neg Hx    History  Sexual Activity  . Sexual activity: Not on file    Outpatient Encounter Prescriptions as of 08/09/2016  Medication Sig  . aspirin 325 MG tablet Take 325 mg by mouth daily.    Marland Kitchen atorvastatin (LIPITOR) 20 MG tablet take 1 tablet by mouth once daily  . Multiple Vitamin (MULTIVITAMIN) tablet Take 1 tablet by mouth daily.    Marland Kitchen PRESCRIPTION MEDICATION at bedtime. Eye drop for glaucoma  . [DISCONTINUED] atorvastatin (LIPITOR) 20 MG tablet take 1 tablet  by mouth once daily  . [DISCONTINUED] ibuprofen (ADVIL,MOTRIN) 600 MG tablet Take 1 tablet (600 mg total) by mouth every 8 (eight) hours as needed for pain.  . [DISCONTINUED] meclizine (ANTIVERT) 25 MG tablet Take 1 tablet (25 mg total) by mouth 4 (four) times daily as needed for dizziness.   No facility-administered encounter medications on file as of 08/09/2016.     Activities of Daily Living In your present state of health, do you have any difficulty performing the following activities: 08/09/2016  Hearing? N  Vision? N  Difficulty concentrating or making decisions? N  Walking or climbing stairs? N  Dressing or bathing? N  Doing errands, shopping? N  Preparing Food and eating ? N  Using the Toilet? N  In the past six months, have you accidently leaked urine? N  Do you have problems with loss of bowel control? N  Managing your Medications? N  Managing your Finances? N  Housekeeping or managing your Housekeeping? N  Some recent data might be hidden    Patient Care Team: Laurey Morale, MD as PCP - General   Assessment:     Exercise Activities and Dietary recommendations Current Exercise Habits: Home exercise routine, Type of exercise: strength training/weights;walking, Time (Minutes): 45, Frequency (Times/Week): 5, Weekly Exercise (Minutes/Week): 225, Intensity: Moderate  Goals    . patient          Keep my exercise up;       Fall Risk Fall Risk  08/09/2016 02/14/2015  Falls in the past year? No No   Depression Screen PHQ 2/9 Scores 08/09/2016 02/14/2015  PHQ - 2 Score 0 0    Cognitive Function no issues presenting         Immunization History  Administered Date(s) Administered  . Td 08/20/1999, 10/23/2009   Screening Tests Health Maintenance  Topic Date Due  . Hepatitis C Screening  July 07, 1946  . ZOSTAVAX  09/30/2005  . PNA vac Low Risk Adult (1 of 2 - PCV13) 09/30/2010  . INFLUENZA VACCINE  03/19/2016  . TETANUS/TDAP  10/24/2019  . COLONOSCOPY   06/18/2020      Plan:     Will take hep c at the next blood draw  Will consider shingles next year as a new vaccine will be out To check benefit; Educated to check with insurance regarding coverage of Shingles vaccination on Part D or Part B and may have lower co-pay if provided on the Part D side  Will take your prevnar; the 1st pneumonia vaccination when you are in the area/ declined today due to time; recommended nurse visit  Informed he can go to [email or web address] to read more about it    During the course of the visit the patient was educated and counseled about the following appropriate screening and preventive services:   Vaccines to include Pneumoccal, Influenza, Hepatitis B, Td, Zostavax, HCV  Electrocardiogram  Cardiovascular Disease  Colorectal cancer screening updated   Diabetes screening neg  Prostate Cancer Screening  Glaucoma screening Dr. Katy Fitch  Nutrition counseling  aequate  Smoking cessation counseling  Patient Instructions (the written plan) was given to the patient.    [id number], RN  08/09/2016  I heave read this note and agree with its contents.  Alysia Penna, MD

## 2016-08-09 NOTE — Patient Instructions (Signed)
[name] , Thank you for taking time to come for your Medicare Wellness Visit. I appreciate your ongoing commitment to your health goals. Please review the following plan we discussed and let me know if I can assist you in the future.   Will take hep c at the next blood draw  Will consider shingles next year as a new vaccine will be out To check benefit; Educated to check with insurance regarding coverage of Shingles vaccination on Part D or Part B and may have lower co-pay if provided on the Part D side  Will take your prevnar; the 1st pneumonia vaccination when you are back this way. You can go to [email or web address] to read more about it   These are the goals we discussed: Goals    . patient          Keep my exercise up;        This is a list of the screening recommended for you and due dates:  Health Maintenance  Topic Date Due  .  Hepatitis C: One time screening is recommended by Center for Disease Control  (CDC) for  adults born from [birth year] through 1965.   Oct 21, 1945  . Shingles Vaccine  09/30/2005  . Pneumonia vaccines (1 of 2 - PCV13) 09/30/2010  . Flu Shot  03/19/2016  . Tetanus Vaccine  10/24/2019  . Colon Cancer Screening  06/18/2020    Fall Prevention in the Home Falls can cause injuries and can affect people from all age groups. There are many simple things that you can do to make your home safe and to help prevent falls. What can I do on the outside of my home?  Regularly repair the edges of walkways and driveways and fix any cracks.  Remove high doorway thresholds.  Trim any shrubbery on the main path into your home.  Use bright outdoor lighting.  Clear walkways of debris and clutter, including tools and rocks.  Regularly check that handrails are securely fastened and in good repair. Both sides of any steps should have handrails.  Install guardrails along the edges of any raised decks or porches.  Have leaves, snow, and ice cleared regularly.  Use sand or salt  on walkways during winter months.  In the garage, clean up any spills right away, including grease or oil spills. What can I do in the bathroom?  Use night lights.  Install grab bars by the toilet and in the tub and shower. Do not use towel bars as grab bars.  Use non-skid mats or decals on the floor of the tub or shower.  If you need to sit down while you are in the shower, use a plastic, non-slip stool.  Keep the floor dry. Immediately clean up any water that spills on the floor.  Remove soap buildup in the tub or shower on a regular basis.  Attach bath mats securely with double-sided non-slip rug tape.  Remove throw rugs and other tripping hazards from the floor. What can I do in the bedroom?  Use night lights.  Make sure that a bedside light is easy to reach.  Do not use oversized bedding that drapes onto the floor.  Have a firm chair that has side arms to use for getting dressed.  Remove throw rugs and other tripping hazards from the floor. What can I do in the kitchen?  Clean up any spills right away.  Avoid walking on wet floors.  Place frequently used items in easy-to-reach places.  If you need to reach for something above you, use a sturdy step stool that has a grab bar.  Keep electrical cables out of the way.  Do not use floor polish or wax that makes floors slippery. If you have to use wax, make sure that it is non-skid floor wax.  Remove throw rugs and other tripping hazards from the floor. What can I do in the stairways?  Do not leave any items on the stairs.  Make sure that there are handrails on both sides of the stairs. Fix handrails that are broken or loose. Make sure that handrails are as long as the stairways.  Check any carpeting to make sure that it is firmly attached to the stairs. Fix any carpet that is loose or worn.  Avoid having throw rugs at the top or bottom of stairways, or secure the rugs with carpet tape to prevent them from  moving.  Make sure that you have a light switch at the top of the stairs and the bottom of the stairs. If you do not have them, have them installed. What are some other fall prevention tips?  Wear closed-toe shoes that fit well and support your feet. Wear shoes that have rubber soles or low heels.  When you use a stepladder, make sure that it is completely opened and that the sides are firmly locked. Have someone hold the ladder while you are using it. Do not climb a closed stepladder.  Add color or contrast paint or tape to grab bars and handrails in your home. Place contrasting color strips on the first and last steps.  Use mobility aids as needed, such as canes, walkers, scooters, and crutches.  Turn on lights if it is dark. Replace any light bulbs that burn out.  Set up furniture so that there are clear paths. Keep the furniture in the same spot.  Fix any uneven floor surfaces.  Choose a carpet design that does not hide the edge of steps of a stairway.  Be aware of any and all pets.  Review your medicines with your healthcare provider. Some medicines can cause dizziness or changes in blood pressure, which increase your risk of falling. Talk with your health care provider about other ways that you can decrease your risk of falls. This may include working with a physical therapist or trainer to improve your strength, balance, and endurance. This information is not intended to replace advice given to you by your health care provider. Make sure you discuss any questions you have with your health care provider. Document Released: 07/26/2002 Document Revised: 01/02/2016 Document Reviewed: 09/09/2014 Elsevier Interactive Patient Education  2017 Peter Bond Maintenance, Male A healthy lifestyle and preventative care can promote health and wellness.  Maintain regular health, dental, and eye exams.  Eat a healthy diet. Foods like vegetables, fruits, whole grains, low-fat dairy  products, and lean protein foods contain the nutrients you need and are low in calories. Decrease your intake of foods high in solid fats, added sugars, and salt. Get information about a proper diet from your health care provider, if necessary.  Regular physical exercise is one of the most important things you can do for your health. Most adults should get at least 150 minutes of moderate-intensity exercise (any activity that increases your heart rate and causes you to sweat) each week. In addition, most adults need muscle-strengthening exercises on 2 or more days a week.   Maintain a healthy weight. The body mass index (  BMI) is a screening tool to identify possible weight problems. It provides an estimate of body fat based on height and weight. Your health care provider can find your BMI and can help you achieve or maintain a healthy weight. For males 20 years and older:  A BMI below 18.5 is considered underweight.  A BMI of 18.5 to 24.9 is normal.  A BMI of 25 to 29.9 is considered overweight.  A BMI of 30 and above is considered obese.  Maintain normal blood lipids and cholesterol by exercising and minimizing your intake of saturated fat. Eat a balanced diet with plenty of fruits and vegetables. Blood tests for lipids and cholesterol should begin at age 26 and be repeated every 5 years. If your lipid or cholesterol levels are high, you are over age 2, or you are at high risk for heart disease, you may need your cholesterol levels checked more frequently.Ongoing high lipid and cholesterol levels should be treated with medicines if diet and exercise are not working.  If you smoke, find out from your health care provider how to quit. If you do not use tobacco, do not start.  Lung cancer screening is recommended for adults aged 65-80 years who are at high risk for developing lung cancer because of a history of smoking. A yearly low-dose CT scan of the lungs is recommended for people who have at  least a 30-pack-year history of smoking and are current smokers or have quit within the past 15 years. A pack year of smoking is smoking an average of 1 pack of cigarettes a day for 1 year (for example, a 30-pack-year history of smoking could mean smoking 1 pack a day for 30 years or 2 packs a day for 15 years). Yearly screening should continue until the smoker has stopped smoking for at least 15 years. Yearly screening should be stopped for people who develop a health problem that would prevent them from having lung cancer treatment.  If you choose to drink alcohol, do not have more than 2 drinks per day. One drink is considered to be 12 oz (360 mL) of beer, 5 oz (150 mL) of wine, or 1.5 oz (45 mL) of liquor.  Avoid the use of street drugs. Do not share needles with anyone. Ask for help if you need support or instructions about stopping the use of drugs.  High blood pressure causes heart disease and increases the risk of stroke. High blood pressure is more likely to develop in:  People who have blood pressure in the end of the normal range (100-139/85-89 mm Hg).  People who are overweight or obese.  People who are African American.  If you are 37-74 years of age, have your blood pressure checked every 3-5 years. If you are 49 years of age or older, have your blood pressure checked every year. You should have your blood pressure measured twice-once when you are at a hospital or clinic, and once when you are not at a hospital or clinic. Record the average of the two measurements. To check your blood pressure when you are not at a hospital or clinic, you can use:  An automated blood pressure machine at a pharmacy.  A home blood pressure monitor.  If you are 72-82 years old, ask your health care provider if you should take aspirin to prevent heart disease.  Diabetes screening involves taking a blood sample to check your fasting blood sugar level. This should be done once every 3 years after  age 84  if you are at a normal weight and without risk factors for diabetes. Testing should be considered at a younger age or be carried out more frequently if you are overweight and have at least 1 risk factor for diabetes.  Colorectal cancer can be detected and often prevented. Most routine colorectal cancer screening begins at the age of 35 and continues through age 71. However, your health care provider may recommend screening at an earlier age if you have risk factors for colon cancer. On a yearly basis, your health care provider may provide home test kits to check for hidden blood in the stool. A small camera at the end of a tube may be used to directly examine the colon (sigmoidoscopy or colonoscopy) to detect the earliest forms of colorectal cancer. Talk to your health care provider about this at age 86 when routine screening begins. A direct exam of the colon should be repeated every 5-10 years through age 80, unless early forms of precancerous polyps or small growths are found.  People who are at an increased risk for hepatitis B should be screened for this virus. You are considered at high risk for hepatitis B if:  You were born in a country where hepatitis B occurs often. Talk with your health care provider about which countries are considered high risk.  Your parents were born in a high-risk country and you have not received a shot to protect against hepatitis B (hepatitis B vaccine).  You have HIV or AIDS.  You use needles to inject street drugs.  You live with, or have sex with, someone who has hepatitis B.  You are a man who has sex with other men (MSM).  You get hemodialysis treatment.  You take certain medicines for conditions like cancer, organ transplantation, and autoimmune conditions.  Hepatitis C blood testing is recommended for all people born from [birth year] through 1965 and any individual with known risk factors for hepatitis C.  Healthy men should no longer receive  prostate-specific antigen (PSA) blood tests as part of routine cancer screening. Talk to your health care provider about prostate cancer screening.  Testicular cancer screening is not recommended for adolescents or adult males who have no symptoms. Screening includes self-exam, a health care provider exam, and other screening tests. Consult with your health care provider about any symptoms you have or any concerns you have about testicular cancer.  Practice safe sex. Use condoms and avoid high-risk sexual practices to reduce the spread of sexually transmitted infections (STIs).  You should be screened for STIs, including gonorrhea and chlamydia if:  You are sexually active and are younger than 24 years.  You are older than 24 years, and your health care provider tells you that you are at risk for this type of infection.  Your sexual activity has changed since you were last screened, and you are at an increased risk for chlamydia or gonorrhea. Ask your health care provider if you are at risk.  If you are at risk of being infected with HIV, it is recommended that you take a prescription medicine daily to prevent HIV infection. This is called pre-exposure prophylaxis (PrEP). You are considered at risk if:  You are a man who has sex with other men (MSM).  You are a heterosexual man who is sexually active with multiple partners.  You take drugs by injection.  You are sexually active with a partner who has HIV.  Talk with your health care provider about whether  you are at high risk of being infected with HIV. If you choose to begin PrEP, you should first be tested for HIV. You should then be tested every 3 months for as long as you are taking PrEP.  Use sunscreen. Apply sunscreen liberally and repeatedly throughout the day. You should seek shade when your shadow is shorter than you. Protect yourself by wearing long sleeves, pants, a wide-brimmed hat, and sunglasses year round whenever you are  outdoors.  Tell your health care provider of new moles or changes in moles, especially if there is a change in shape or color. Also, tell your health care provider if a mole is larger than the size of a pencil eraser.  A one-time screening for abdominal aortic aneurysm (AAA) and surgical repair of large AAAs by ultrasound is recommended for men aged 59-75 years who are current or former smokers.  Stay current with your vaccines (immunizations). This information is not intended to replace advice given to you by your health care provider. Make sure you discuss any questions you have with your health care provider. Document Released: 02/01/2008 Document Revised: 08/26/2014 Document Reviewed: 05/09/2015 Elsevier Interactive Patient Education  2017 [employer].

## 2016-08-09 NOTE — Progress Notes (Signed)
   Subjective:    Patient ID: Peter Bond, male    DOB: [birth year], 70 y.o.   MRN: [id number]  HPI 70 yr old male for a well exam. He feels fine. He still works full time.    Review of Systems  Constitutional: Negative.   HENT: Negative.   Eyes: Negative.   Respiratory: Negative.   Cardiovascular: Negative.   Gastrointestinal: Negative.   Genitourinary: Negative.   Musculoskeletal: Negative.   Skin: Negative.   Neurological: Negative.   Psychiatric/Behavioral: Negative.        Objective:   Physical Exam  Constitutional: He is oriented to person, place, and time. He appears well-developed and well-nourished. No distress.  HENT:  Head: Normocephalic and atraumatic.  Right Ear: External ear normal.  Left Ear: External ear normal.  Nose: Nose normal.  Mouth/Throat: Oropharynx is clear and moist. No oropharyngeal exudate.  Eyes: Conjunctivae and EOM are normal. Pupils are equal, round, and reactive to light. Right eye exhibits no discharge. Left eye exhibits no discharge. No scleral icterus.  Neck: Neck supple. No JVD present. No tracheal deviation present. No thyromegaly present.  Cardiovascular: Normal rate, regular rhythm, normal heart sounds and intact distal pulses.  Exam reveals no gallop and no friction rub.   No murmur heard. Pulmonary/Chest: Effort normal and breath sounds normal. No respiratory distress. He has no wheezes. He has no rales. He exhibits no tenderness.  Abdominal: Soft. Bowel sounds are normal. He exhibits no distension and no mass. There is no tenderness. There is no rebound and no guarding.  Genitourinary: Rectum normal, prostate normal and penis normal. Rectal exam shows guaiac negative stool. No penile tenderness.  Musculoskeletal: Normal range of motion. He exhibits no edema or tenderness.  Lymphadenopathy:    He has no cervical adenopathy.  Neurological: He is alert and oriented to person, place, and time. He has normal reflexes. No cranial nerve  deficit. He exhibits normal muscle tone. Coordination normal.  Skin: Skin is warm and dry. No rash noted. He is not diaphoretic. No erythema. No pallor.  Psychiatric: He has a normal mood and affect. His behavior is normal. Judgment and thought content normal.          Assessment & Plan:  Well exam. We discussed diet and exercise. Get fasting labs.  Alysia Penna, MD

## 2016-10-24 ENCOUNTER — Telehealth: Payer: Self-pay | Admitting: Family Medicine

## 2016-10-24 NOTE — Telephone Encounter (Signed)
Pt state that the Rx for his eyes is on back order at his pharmacy and do not know when they will get it in and would like to know if the doctor could call in something different.  Advised the pt that he needs to call his insurance company to see which one they will pay for and give [place] a call back with the information due to the doctor not knowing which one they will and will not pay for.

## 2016-10-25 NOTE — Telephone Encounter (Signed)
Actually I would just have him see his pharmacist. They could suggest an alternative that they have in stock and that is covered on his plan

## 2016-10-25 NOTE — Telephone Encounter (Signed)
I spoke with pt and he has already spoken with eye doctor, this has been taken care of.

## 2016-11-13 DIAGNOSIS — H40053 Ocular hypertension, bilateral: Secondary | ICD-10-CM | POA: Diagnosis not present

## 2016-11-13 DIAGNOSIS — H11003 Unspecified pterygium of eye, bilateral: Secondary | ICD-10-CM | POA: Diagnosis not present

## 2016-11-13 DIAGNOSIS — H2511 Age-related nuclear cataract, right eye: Secondary | ICD-10-CM | POA: Diagnosis not present

## 2016-11-13 DIAGNOSIS — Z961 Presence of intraocular lens: Secondary | ICD-10-CM | POA: Diagnosis not present

## 2016-12-06 ENCOUNTER — Encounter: Payer: Self-pay | Admitting: Family Medicine

## 2016-12-06 ENCOUNTER — Ambulatory Visit (INDEPENDENT_AMBULATORY_CARE_PROVIDER_SITE_OTHER): Payer: Medicare Other | Admitting: Family Medicine

## 2016-12-06 ENCOUNTER — Telehealth: Payer: Self-pay | Admitting: Family Medicine

## 2016-12-06 VITALS — BP 120/82 | HR 68 | Temp 97.8°F | Wt 170.8 lb

## 2016-12-06 DIAGNOSIS — S39012A Strain of muscle, fascia and tendon of lower back, initial encounter: Secondary | ICD-10-CM

## 2016-12-06 MED ORDER — CYCLOBENZAPRINE HCL 10 MG PO TABS: 10.0000 mg | ORAL_TABLET | Freq: Three times a day (TID) | ORAL | 1 refills | Status: DC | PRN

## 2016-12-06 NOTE — Progress Notes (Signed)
   Subjective:    Patient ID: Peter Bond, male    DOB: [birth year], 71 y.o.   MRN: [id number]  HPI Here for pain in the left lower back. 3 days ago while repairing some gutters on his house he fell off a ladder and landed on the ground on his back. He was a little sore after that and he took some Advil and soaked in a hot bath tub. Yesterday he felt much better and went to work. Then last night as he was bending over to tie his shoe he felt a sudden sharp pain in the back. This pain has persisted since then. No pain or numbness in the legs.    Review of Systems  Constitutional: Negative.   Gastrointestinal: Negative.   Genitourinary: Negative.   Musculoskeletal: Positive for back pain.       Objective:   Physical Exam  Constitutional: He appears well-developed and well-nourished.  Musculoskeletal:  Tender in the left lower back with some spasm. Full ROM and negative SLR          Assessment & Plan:  Lumbar strain. Use 800 mg of Ibuprofen as needed and add Flexeril prn. Recheck prn.  Alysia Penna, MD

## 2016-12-06 NOTE — Patient Instructions (Signed)
WE NOW OFFER   [facility] FAST TRACK!!!  SAME DAY Appointments for ACUTE CARE  Such as: Sprains, Injuries, cuts, abrasions, rashes, muscle pain, joint pain, back pain Colds, flu, sore throats, headache, allergies, cough, fever  Ear pain, sinus and eye infections Abdominal pain, nausea, vomiting, diarrhea, upset stomach Animal/insect bites  3 Easy Ways to Schedule: Walk-In Scheduling Call in scheduling Mychart Sign-up: [email or web address]         

## 2016-12-06 NOTE — Progress Notes (Signed)
Pre visit review using our clinic review tool, if applicable. No additional management support is needed unless otherwise documented below in the visit note. 

## 2016-12-06 NOTE — Telephone Encounter (Signed)
[facility] [address] does not have an power and [facility] [address]. Pt is still waiting

## 2016-12-06 NOTE — Telephone Encounter (Signed)
Error

## 2016-12-06 NOTE — Telephone Encounter (Signed)
Rx sent 

## 2017-03-24 ENCOUNTER — Encounter: Payer: Self-pay | Admitting: Family Medicine

## 2017-03-24 ENCOUNTER — Telehealth: Payer: Self-pay | Admitting: Family Medicine

## 2017-03-24 ENCOUNTER — Ambulatory Visit (INDEPENDENT_AMBULATORY_CARE_PROVIDER_SITE_OTHER): Payer: Medicare Other | Admitting: Family Medicine

## 2017-03-24 VITALS — Temp 98.1°F | Ht 68.0 in | Wt 166.0 lb

## 2017-03-24 DIAGNOSIS — B081 Molluscum contagiosum: Secondary | ICD-10-CM

## 2017-03-24 MED ORDER — TRETINOIN 0.05 % EX CREA: TOPICAL_CREAM | Freq: Every day | CUTANEOUS | 0 refills | Status: DC

## 2017-03-24 NOTE — Telephone Encounter (Signed)
I spoke with pt and went over information.  

## 2017-03-24 NOTE — Progress Notes (Signed)
   Subjective:    Patient ID: Peter Bond, male    DOB: [birth year], 71 y.o.   MRN: [id number]  HPI Here for one week of an itchy rash on both arms. He has tried Benadryl cream with no effect.    Review of Systems  Constitutional: Negative.   Respiratory: Negative.   Skin: Positive for rash.       Objective:   Physical Exam  Constitutional: He appears well-developed and well-nourished.  Cardiovascular: Normal rate, regular rhythm, normal heart sounds and intact distal pulses.   Pulmonary/Chest: Effort normal and breath sounds normal.  Skin:  Both forearms (left more than right) have scattered single umbilicated papules           Assessment & Plan:  Molluscum contagiosum. Apply Retin A cream nightly until clear.  Alysia Penna, MD

## 2017-03-24 NOTE — Patient Instructions (Signed)
WE NOW OFFER   [facility] FAST TRACK!!!  SAME DAY Appointments for ACUTE CARE  Such as: Sprains, Injuries, cuts, abrasions, rashes, muscle pain, joint pain, back pain Colds, flu, sore throats, headache, allergies, cough, fever  Ear pain, sinus and eye infections Abdominal pain, nausea, vomiting, diarrhea, upset stomach Animal/insect bites  3 Easy Ways to Schedule: Walk-In Scheduling Call in scheduling Mychart Sign-up: [email or web address]         

## 2017-03-24 NOTE — Telephone Encounter (Signed)
Pt states the tretinoin (RETIN-A) 0.05 % cream  was $125. Pt wants to know if you have another cream or a smaller portion of the med he can get?  [facility], [place]

## 2017-03-24 NOTE — Telephone Encounter (Signed)
Instead of the Rretin A cream. He can try OTC 0.5% Podophyllotoxin cream, apply as directed. This should cost less

## 2017-04-22 DIAGNOSIS — H2511 Age-related nuclear cataract, right eye: Secondary | ICD-10-CM | POA: Diagnosis not present

## 2017-04-22 DIAGNOSIS — H11003 Unspecified pterygium of eye, bilateral: Secondary | ICD-10-CM | POA: Diagnosis not present

## 2017-04-22 DIAGNOSIS — H40053 Ocular hypertension, bilateral: Secondary | ICD-10-CM | POA: Diagnosis not present

## 2017-04-22 DIAGNOSIS — Z961 Presence of intraocular lens: Secondary | ICD-10-CM | POA: Diagnosis not present

## 2017-04-22 LAB — HM DIABETES EYE EXAM

## 2017-04-30 ENCOUNTER — Other Ambulatory Visit: Payer: Self-pay | Admitting: Neurological Surgery

## 2017-04-30 DIAGNOSIS — D352 Benign neoplasm of pituitary gland: Secondary | ICD-10-CM

## 2017-05-08 ENCOUNTER — Encounter: Payer: Self-pay | Admitting: Family Medicine

## 2017-05-15 ENCOUNTER — Ambulatory Visit
Admission: RE | Admit: 2017-05-15 | Discharge: 2017-05-15 | Disposition: A | Discharge status: Home / Self Care | Payer: Medicare Other | Source: Ambulatory Visit | Attending: Neurological Surgery | Admitting: Neurological Surgery

## 2017-05-15 DIAGNOSIS — D352 Benign neoplasm of pituitary gland: Secondary | ICD-10-CM

## 2017-05-15 MED ORDER — GADOBENATE DIMEGLUMINE 529 MG/ML IV SOLN
10.0000 mL | Freq: Once | INTRAVENOUS | Status: AC | PRN
  Administered 2017-05-15: 10 mL via INTRAVENOUS

## 2017-06-04 DIAGNOSIS — D352 Benign neoplasm of pituitary gland: Secondary | ICD-10-CM | POA: Diagnosis not present

## 2017-06-04 DIAGNOSIS — R030 Elevated blood-pressure reading, without diagnosis of hypertension: Secondary | ICD-10-CM | POA: Diagnosis not present

## 2017-08-28 ENCOUNTER — Other Ambulatory Visit: Payer: Self-pay

## 2017-08-28 MED ORDER — ATORVASTATIN CALCIUM 20 MG PO TABS: ORAL_TABLET | ORAL | 2 refills | Status: DC

## 2017-09-16 NOTE — Progress Notes (Addendum)
Subjective:   Peter Bond is a 72 y.o. male who presents for Medicare Annual/Subsequent preventive examination.  Last AWV 07/2016 Reports health as very good   Married Children 3  [employer];    Diet Eats nutritiously Breakfast lunch and supper  Sleeps well No stress  BMI 28   Lipids chol/hdl ratio is 3; chol 178; hdl 55; ldl 108; trig 70    Exercise Does sit up and push ups at home [facility] a lot during the day Has fit bit; 12000 steps per day    Health Maintenance Due  Topic Date Due  . Hepatitis C Screening  1946-07-17  . PNA vac Low Risk Adult (1 of 2 - PCV13) 09/30/2010  . INFLUENZA VACCINE  03/19/2017   Discussed Hep C but labs already drawn today  Will place order in epic for next blood draw  Declines pneumonia vaccines listed States he did take the flu vaccine at work this year and documented this    Colonoscopy 05/2015 - due 05/2020  Shingrix - shingles vaccination  Education provided - feels he had the zoster     Shingrix is a vaccine for the prevention of Shingles in Adults 50 and older.  If you are on Medicare, you can request a prescription from your doctor to be filled at a pharmacy.  Please check with your benefits regarding applicable copays or out of pocket expenses.  The Shingrix is given in 2 vaccines approx 8 weeks apart. You must receive the 2nd dose prior to 6 months from receipt of the first.       Objective:    Vitals: There were no vitals taken for this visit.  There is no height or weight on file to calculate BMI.  Advanced Directives 08/09/2016 05/22/2015  Does Patient Have a Medical Advance Directive? Yes Yes  Type of Advance Directive Living will;Healthcare Power of Attorney Living will    Tobacco Social History   Tobacco Use  Smoking Status Never Smoker  Smokeless Tobacco Never Used     Counseling given: Not Answered   Clinical Intake:      Past Medical History:  Diagnosis Date  . Cataract    1980's  cataract removed  . GERD (gastroesophageal reflux disease)   . Glaucoma    left eye   . Hyperlipidemia    Past Surgical History:  Procedure Laterality Date  . CATARACT EXTRACTION     1980's  . COLONOSCOPY  06-19-15   per Dr. Havery Moros, adenomatous polyp and sigmoid diverticulosis, repeat in 5 yrs    . corneal implant    . PITUITARY EXCISION     pituitary gland removal   Family History  Problem Relation Age of Onset  . Prostate cancer Brother   . Stroke Unknown   . Prostate cancer Brother   . Colon cancer Neg Hx   . Esophageal cancer Neg Hx   . Rectal cancer Neg Hx   . Stomach cancer Neg Hx    Social History   Socioeconomic History  . Marital status: Married    Spouse name: Not on file  . Number of children: Not on file  . Years of education: Not on file  . Highest education level: Not on file  Social Needs  . Financial resource strain: Not on file  . Food insecurity - worry: Not on file  . Food insecurity - inability: Not on file  . Transportation needs - medical: Not on file  . Transportation needs - non-medical:  Not on file  Occupational History  . Not on file  Tobacco Use  . Smoking status: Never Smoker  . Smokeless tobacco: Never Used  Substance and Sexual Activity  . Alcohol use: Yes    Alcohol/week: 0.0 oz    Comment: rare  . Drug use: No  . Sexual activity: Not on file  Other Topics Concern  . Not on file  Social History Narrative  . Not on file    Outpatient Encounter Medications as of 09/17/2017  Medication Sig  . aspirin 325 MG tablet Take 325 mg by mouth daily.    Marland Kitchen atorvastatin (LIPITOR) 20 MG tablet take 1 tablet by mouth once daily  . Multiple Vitamin (MULTIVITAMIN) tablet Take 1 tablet by mouth daily.    Marland Kitchen PRESCRIPTION MEDICATION at bedtime. Eye drop for glaucoma  . tretinoin (RETIN-A) 0.05 % cream Apply topically at bedtime.   No facility-administered encounter medications on file as of 09/17/2017.     Activities of Daily Living No  flowsheet data found.  Patient Care Team: Laurey Morale, MD as PCP - General   Assessment:   This is a routine wellness examination for Peter Bond.  Exercise Activities and Dietary recommendations    Goals    None      Fall Risk Fall Risk  08/09/2016 02/14/2015  Falls in the past year? No No    Depression Screen PHQ 2/9 Scores 08/09/2016 02/14/2015  PHQ - 2 Score 0 0    Cognitive Function MMSE - Mini Mental State Exam 08/09/2016  Not completed: (No Data)     Ad8 score reviewed for issues:  Issues making decisions:  Less interest in hobbies / activities:  Repeats questions, stories (family complaining):  Trouble using ordinary gadgets (microwave, computer, phone):  Forgets the month or year:   Mismanaging finances:   Remembering appts:  Daily problems with thinking and/or memory: Ad8 score is=0 Still working without difficulty in [employer]    ;    Immunization History  Administered Date(s) Administered  . Td 08/20/1999, 10/23/2009    Screening Tests Health Maintenance  Topic Date Due  . Hepatitis C Screening  08-17-1946  . PNA vac Low Risk Adult (1 of 2 - PCV13) 09/30/2010  . INFLUENZA VACCINE  03/19/2017  . TETANUS/TDAP  10/24/2019  . COLONOSCOPY  06/18/2020       Plan:      PCP Notes   Health Maintenance Declined pneumonia vaccines today but educated States he has the flu vaccine at work in October and this was added Agrees to Hepatitis C screen at his next blood draw. (Blood was drawn prior to AWV)  Educated regarding shingrix   MEDS Added Dorzolaminde-Timolol (Cosopt) Latanoprost (xalatan)  Confirmed per the patient  (called Dr. Katy Fitch and they will fax last eye exam for confirmation)    Abnormal Screens  none  Referrals  None  Patient concerns; none  Nurse Concerns; As noted  Next PCP apt Was seen today    I have personally reviewed and noted the following in the patient's chart:   . Medical and social history . Use  of alcohol, tobacco or illicit drugs  . Current medications and supplements . Functional ability and status . Nutritional status . Physical activity . Advanced directives . List of other physicians . Hospitalizations, surgeries, and ER visits in previous 12 months . Vitals . Screenings to include cognitive, depression, and falls . Referrals and appointments  In addition, I have reviewed and discussed with patient certain  preventive protocols, quality metrics, and best practice recommendations. A written personalized care plan for preventive services as well as general preventive health recommendations were provided to patient.     [email or web address],[id number], RN  09/16/2017  I have reviewed this note and agree with its contents.  Alysia Penna, MD

## 2017-09-17 ENCOUNTER — Ambulatory Visit (INDEPENDENT_AMBULATORY_CARE_PROVIDER_SITE_OTHER): Payer: Medicare Other

## 2017-09-17 ENCOUNTER — Encounter: Payer: Self-pay | Admitting: Family Medicine

## 2017-09-17 ENCOUNTER — Ambulatory Visit (INDEPENDENT_AMBULATORY_CARE_PROVIDER_SITE_OTHER): Payer: Medicare Other | Admitting: Family Medicine

## 2017-09-17 VITALS — BP 122/70 | HR 64 | Temp 98.2°F | Ht 65.75 in | Wt 170.4 lb

## 2017-09-17 VITALS — BP 122/70 | Ht 65.0 in | Wt 170.4 lb

## 2017-09-17 DIAGNOSIS — Z1159 Encounter for screening for other viral diseases: Secondary | ICD-10-CM

## 2017-09-17 DIAGNOSIS — N138 Other obstructive and reflux uropathy: Secondary | ICD-10-CM

## 2017-09-17 DIAGNOSIS — R030 Elevated blood-pressure reading, without diagnosis of hypertension: Secondary | ICD-10-CM | POA: Diagnosis not present

## 2017-09-17 DIAGNOSIS — Z23 Encounter for immunization: Secondary | ICD-10-CM

## 2017-09-17 DIAGNOSIS — Z0000 Encounter for general adult medical examination without abnormal findings: Secondary | ICD-10-CM | POA: Diagnosis not present

## 2017-09-17 DIAGNOSIS — E785 Hyperlipidemia, unspecified: Secondary | ICD-10-CM | POA: Diagnosis not present

## 2017-09-17 DIAGNOSIS — N401 Enlarged prostate with lower urinary tract symptoms: Secondary | ICD-10-CM | POA: Diagnosis not present

## 2017-09-17 LAB — CBC WITH DIFFERENTIAL/PLATELET
BASOS ABS: 0 10*3/uL (ref 0.0–0.1)
Basophils Relative: 0.7 % (ref 0.0–3.0)
EOS ABS: 0.2 10*3/uL (ref 0.0–0.7)
Eosinophils Relative: 5.7 % — ABNORMAL HIGH (ref 0.0–5.0)
HEMATOCRIT: 44.9 % (ref 39.0–52.0)
Hemoglobin: 14.9 g/dL (ref 13.0–17.0)
LYMPHS ABS: 1.3 10*3/uL (ref 0.7–4.0)
LYMPHS PCT: 38.1 % (ref 12.0–46.0)
MCHC: 33.2 g/dL (ref 30.0–36.0)
MCV: 87.6 fl (ref 78.0–100.0)
Monocytes Absolute: 0.4 10*3/uL (ref 0.1–1.0)
Monocytes Relative: 12.3 % — ABNORMAL HIGH (ref 3.0–12.0)
NEUTROS ABS: 1.5 10*3/uL (ref 1.4–7.7)
NEUTROS PCT: 43.2 % (ref 43.0–77.0)
PLATELETS: 200 10*3/uL (ref 150.0–400.0)
RBC: 5.12 Mil/uL (ref 4.22–5.81)
RDW: 13.6 % (ref 11.5–15.5)
WBC: 3.4 10*3/uL — ABNORMAL LOW (ref 4.0–10.5)

## 2017-09-17 LAB — HEPATIC FUNCTION PANEL
ALBUMIN: 4.1 g/dL (ref 3.5–5.2)
ALT: 20 U/L (ref 0–53)
AST: 17 U/L (ref 0–37)
Alkaline Phosphatase: 72 U/L (ref 39–117)
BILIRUBIN TOTAL: 0.9 mg/dL (ref 0.2–1.2)
Bilirubin, Direct: 0.2 mg/dL (ref 0.0–0.3)
Total Protein: 6.5 g/dL (ref 6.0–8.3)

## 2017-09-17 LAB — POC URINALSYSI DIPSTICK (AUTOMATED)
BILIRUBIN UA: NEGATIVE
Glucose, UA: NEGATIVE
Ketones, UA: NEGATIVE
LEUKOCYTES UA: NEGATIVE
NITRITE UA: NEGATIVE
PH UA: 7 (ref 5.0–8.0)
Protein, UA: NEGATIVE
RBC UA: NEGATIVE
Spec Grav, UA: 1.02 (ref 1.010–1.025)
Urobilinogen, UA: 0.2 E.U./dL

## 2017-09-17 LAB — LIPID PANEL
CHOL/HDL RATIO: 3
Cholesterol: 172 mg/dL (ref 0–200)
HDL: 55.1 mg/dL (ref >39.00)
LDL CALC: 99 mg/dL (ref 0–99)
NONHDL: 116.75
TRIGLYCERIDES: 87 mg/dL (ref 0.0–149.0)
VLDL: 17.4 mg/dL (ref 0.0–40.0)

## 2017-09-17 LAB — BASIC METABOLIC PANEL
BUN: 17 mg/dL (ref 6–23)
CO2: 33 mEq/L — ABNORMAL HIGH (ref 19–32)
CREATININE: 1.31 mg/dL (ref 0.40–1.50)
Calcium: 8.9 mg/dL (ref 8.4–10.5)
Chloride: 103 mEq/L (ref 96–112)
GFR: 69.18 mL/min (ref >60.00)
Glucose, Bld: 84 mg/dL (ref 70–99)
Potassium: 4.5 mEq/L (ref 3.5–5.1)
Sodium: 141 mEq/L (ref 135–145)

## 2017-09-17 LAB — TSH: TSH: 0.91 u[IU]/mL (ref 0.35–4.50)

## 2017-09-17 LAB — PSA: PSA: 2.91 ng/mL (ref 0.10–4.00)

## 2017-09-17 NOTE — Progress Notes (Unsigned)
rec'd fax from Dr. Katy Fitch Last eye exam 04/22/2017  Ocular meds reviewed and added to med list Eye qtts added Dorzolamide-timolol and Latanoprost for left eye  Notes Glaucoma suspect with OHT OU

## 2017-09-17 NOTE — Progress Notes (Signed)
   Subjective:    Patient ID: Peter Bond, male    DOB: [birth year], 72 y.o.   MRN: [id number]  HPI Here to follow up. He feels great. His BP has been stable. He exercises every day.    Review of Systems  Constitutional: Negative.   HENT: Negative.   Eyes: Negative.   Respiratory: Negative.   Cardiovascular: Negative.   Gastrointestinal: Negative.   Genitourinary: Negative.   Musculoskeletal: Negative.   Skin: Negative.   Neurological: Negative.   Psychiatric/Behavioral: Negative.        Objective:   Physical Exam  Constitutional: He is oriented to person, place, and time. He appears well-developed and well-nourished. No distress.  HENT:  Head: Normocephalic and atraumatic.  Right Ear: External ear normal.  Left Ear: External ear normal.  Nose: Nose normal.  Mouth/Throat: Oropharynx is clear and moist. No oropharyngeal exudate.  Eyes: Conjunctivae and EOM are normal. Pupils are equal, round, and reactive to light. Right eye exhibits no discharge. Left eye exhibits no discharge. No scleral icterus.  Neck: Neck supple. No JVD present. No tracheal deviation present. No thyromegaly present.  Cardiovascular: Normal rate, regular rhythm, normal heart sounds and intact distal pulses. Exam reveals no gallop and no friction rub.  No murmur heard. Pulmonary/Chest: Effort normal and breath sounds normal. No respiratory distress. He has no wheezes. He has no rales. He exhibits no tenderness.  Abdominal: Soft. Bowel sounds are normal. He exhibits no distension and no mass. There is no tenderness. There is no rebound and no guarding.  Genitourinary: Rectum normal, prostate normal and penis normal. Rectal exam shows guaiac negative stool. No penile tenderness.  Musculoskeletal: Normal range of motion. He exhibits no edema or tenderness.  Lymphadenopathy:    He has no cervical adenopathy.  Neurological: He is alert and oriented to person, place, and time. He has normal reflexes. No cranial  nerve deficit. He exhibits normal muscle tone. Coordination normal.  Skin: Skin is warm and dry. No rash noted. He is not diaphoretic. No erythema. No pallor.  Psychiatric: He has a normal mood and affect. His behavior is normal. Judgment and thought content normal.          Assessment & Plan:  He is dping well. BP is stable. We will get fasting labs today to check lipids, etc.  Alysia Penna, MD

## 2017-09-17 NOTE — Patient Instructions (Addendum)
Peter Bond , Thank you for taking time to come for your Medicare Wellness Visit. I appreciate your ongoing commitment to your health goals. Please review the following plan we discussed and let me know if I can assist you in the future.   The Centers for Disease Control are now recommending 2 pneumonia vaccinations after 34. The first is the Prevnar 13. This helps to boost your immunity to community acquired pneumonia as well as some protection from bacterial pneumonia  The 2nd is the pneumovax 23, which offers more broad protection!  Please consider taking these as this is your best protection against pneumonia.  Shingrix is a vaccine for the prevention of Shingles in Adults 50 and older.  If you are on Medicare, you can request a prescription from your doctor to be filled at a pharmacy.  Please check with your benefits regarding applicable copays or out of pocket expenses.  The Shingrix is given in 2 vaccines approx 8 weeks apart. You must receive the 2nd dose prior to 6 months from receipt of the first.   Medicare now request all "baby boomers" test for possible exposure to Hepatitis C. Many may have been exposed due to dental work, tatoo's, vaccinations when young. The Hepatitis C virus is dormant for many years and then sometimes will cause liver cancer. If you gave blood in the past 15 years, you were most likely checked for Hep C. If you rec'd blood; you may want to consider testing or if you are high risk for any other reason.      These are the goals we discussed: Goals    . Patient Stated     Will try to slow down Go to 5 days a week!       This is a list of the screening recommended for you and due dates:  Health Maintenance  Topic Date Due  .  Hepatitis C: One time screening is recommended by Center for Disease Control  (CDC) for  adults born from [birth year] through 1965.   [birth year]  . Pneumonia vaccines (1 of 2 - PCV13) 09/30/2010  . Flu Shot  03/19/2017  . Tetanus  Vaccine  10/24/2019  . Colon Cancer Screening  06/18/2020     Health Maintenance, Male A healthy lifestyle and preventive care is important for your health and wellness. Ask your health care provider about what schedule of regular examinations is right for you. What should I know about weight and diet? Eat a Healthy Diet  Eat plenty of vegetables, fruits, whole grains, low-fat dairy products, and lean protein.  Do not eat a lot of foods high in solid fats, added sugars, or salt.  Maintain a Healthy Weight Regular exercise can help you achieve or maintain a healthy weight. You should:  Do at least 150 minutes of exercise each week. The exercise should increase your heart rate and make you sweat (moderate-intensity exercise).  Do strength-training exercises at least twice a week.  Watch Your Levels of Cholesterol and Blood Lipids  Have your blood tested for lipids and cholesterol every 5 years starting at 72 years of age. If you are at high risk for heart disease, you should start having your blood tested when you are 72 years old. You may need to have your cholesterol levels checked more often if: ? Your lipid or cholesterol levels are high. ? You are older than 72 years of age. ? You are at high risk for heart disease.  What should I know  about cancer screening? Many types of cancers can be detected early and may often be prevented. Lung Cancer  You should be screened every year for lung cancer if: ? You are a current smoker who has smoked for at least 30 years. ? You are a former smoker who has quit within the past 15 years.  Talk to your health care provider about your screening options, when you should start screening, and how often you should be screened.  Colorectal Cancer  Routine colorectal cancer screening usually begins at 72 years of age and should be repeated every 5-10 years until you are 72 years old. You may need to be screened more often if early forms of  precancerous polyps or small growths are found. Your health care provider may recommend screening at an earlier age if you have risk factors for colon cancer.  Your health care provider may recommend using home test kits to check for hidden blood in the stool.  A small camera at the end of a tube can be used to examine your colon (sigmoidoscopy or colonoscopy). This checks for the earliest forms of colorectal cancer.  Prostate and Testicular Cancer  Depending on your age and overall health, your health care provider may do certain tests to screen for prostate and testicular cancer.  Talk to your health care provider about any symptoms or concerns you have about testicular or prostate cancer.  Skin Cancer  Check your skin from head to toe regularly.  Tell your health care provider about any new moles or changes in moles, especially if: ? There is a change in a mole's size, shape, or color. ? You have a mole that is larger than a pencil eraser.  Always use sunscreen. Apply sunscreen liberally and repeat throughout the day.  Protect yourself by wearing long sleeves, pants, a wide-brimmed hat, and sunglasses when outside.  What should I know about heart disease, diabetes, and high blood pressure?  If you are 36-71 years of age, have your blood pressure checked every 3-5 years. If you are 57 years of age or older, have your blood pressure checked every year. You should have your blood pressure measured twice-once when you are at a hospital or clinic, and once when you are not at a hospital or clinic. Record the average of the two measurements. To check your blood pressure when you are not at a hospital or clinic, you can use: ? An automated blood pressure machine at a pharmacy. ? A home blood pressure monitor.  Talk to your health care provider about your target blood pressure.  If you are between 67-45 years old, ask your health care provider if you should take aspirin to prevent heart  disease.  Have regular diabetes screenings by checking your fasting blood sugar level. ? If you are at a normal weight and have a low risk for diabetes, have this test once every three years after the age of 10. ? If you are overweight and have a high risk for diabetes, consider being tested at a younger age or more often.  A one-time screening for abdominal aortic aneurysm (AAA) by ultrasound is recommended for men aged 22-75 years who are current or former smokers. What should I know about preventing infection? Hepatitis B If you have a higher risk for hepatitis B, you should be screened for this virus. Talk with your health care provider to find out if you are at risk for hepatitis B infection. Hepatitis C Blood testing is  recommended for:  Everyone born from [birth year] through 1965.  Anyone with known risk factors for hepatitis C.  Sexually Transmitted Diseases (STDs)  You should be screened each year for STDs including gonorrhea and chlamydia if: ? You are sexually active and are younger than 72 years of age. ? You are older than 72 years of age and your health care provider tells you that you are at risk for this type of infection. ? Your sexual activity has changed since you were last screened and you are at an increased risk for chlamydia or gonorrhea. Ask your health care provider if you are at risk.  Talk with your health care provider about whether you are at high risk of being infected with HIV. Your health care provider may recommend a prescription medicine to help prevent HIV infection.  What else can I do?  Schedule regular health, dental, and eye exams.  Stay current with your vaccines (immunizations).  Do not use any tobacco products, such as cigarettes, chewing tobacco, and e-cigarettes. If you need help quitting, ask your health care provider.  Limit alcohol intake to no more than 2 drinks per day. One drink equals 12 ounces of beer, 5 ounces of wine, or 1 ounces of  hard liquor.  Do not use street drugs.  Do not share needles.  Ask your health care provider for help if you need support or information about quitting drugs.  Tell your health care provider if you often feel depressed.  Tell your health care provider if you have ever been abused or do not feel safe at home. This information is not intended to replace advice given to you by your health care provider. Make sure you discuss any questions you have with your health care provider. Document Released: 02/01/2008 Document Revised: 04/03/2016 Document Reviewed: 05/09/2015 Elsevier Interactive Patient Education  2018 [place] in the Home Falls can cause injuries and can affect people from all age groups. There are many simple things that you can do to make your home safe and to help prevent falls. What can I do on the outside of my home?  Regularly repair the edges of walkways and driveways and fix any cracks.  Remove high doorway thresholds.  Trim any shrubbery on the main path into your home.  Use bright outdoor lighting.  Clear walkways of debris and clutter, including tools and rocks.  Regularly check that handrails are securely fastened and in good repair. Both sides of any steps should have handrails.  Install guardrails along the edges of any raised decks or porches.  Have leaves, snow, and ice cleared regularly.  Use sand or salt on walkways during winter months.  In the garage, clean up any spills right away, including grease or oil spills. What can I do in the bathroom?  Use night lights.  Install grab bars by the toilet and in the tub and shower. Do not use towel bars as grab bars.  Use non-skid mats or decals on the floor of the tub or shower.  If you need to sit down while you are in the shower, use a plastic, non-slip stool.  Keep the floor dry. Immediately clean up any water that spills on the floor.  Remove soap buildup in the tub or  shower on a regular basis.  Attach bath mats securely with double-sided non-slip rug tape.  Remove throw rugs and other tripping hazards from the floor. What can I do in the bedroom?  Use  night lights.  Make sure that a bedside light is easy to reach.  Do not use oversized bedding that drapes onto the floor.  Have a firm chair that has side arms to use for getting dressed.  Remove throw rugs and other tripping hazards from the floor. What can I do in the kitchen?  Clean up any spills right away.  Avoid walking on wet floors.  Place frequently used items in easy-to-reach places.  If you need to reach for something above you, use a sturdy step stool that has a grab bar.  Keep electrical cables out of the way.  Do not use floor polish or wax that makes floors slippery. If you have to use wax, make sure that it is non-skid floor wax.  Remove throw rugs and other tripping hazards from the floor. What can I do in the stairways?  Do not leave any items on the stairs.  Make sure that there are handrails on both sides of the stairs. Fix handrails that are broken or loose. Make sure that handrails are as long as the stairways.  Check any carpeting to make sure that it is firmly attached to the stairs. Fix any carpet that is loose or worn.  Avoid having throw rugs at the top or bottom of stairways, or secure the rugs with carpet tape to prevent them from moving.  Make sure that you have a light switch at the top of the stairs and the bottom of the stairs. If you do not have them, have them installed. What are some other fall prevention tips?  Wear closed-toe shoes that fit well and support your feet. Wear shoes that have rubber soles or low heels.  When you use a stepladder, make sure that it is completely opened and that the sides are firmly locked. Have someone hold the ladder while you are using it. Do not climb a closed stepladder.  Add color or contrast paint or tape to grab  bars and handrails in your home. Place contrasting color strips on the first and last steps.  Use mobility aids as needed, such as canes, walkers, scooters, and crutches.  Turn on lights if it is dark. Replace any light bulbs that burn out.  Set up furniture so that there are clear paths. Keep the furniture in the same spot.  Fix any uneven floor surfaces.  Choose a carpet design that does not hide the edge of steps of a stairway.  Be aware of any and all pets.  Review your medicines with your healthcare provider. Some medicines can cause dizziness or changes in blood pressure, which increase your risk of falling. Talk with your health care provider about other ways that you can decrease your risk of falls. This may include working with a physical therapist or trainer to improve your strength, balance, and endurance. This information is not intended to replace advice given to you by your health care provider. Make sure you discuss any questions you have with your health care provider. Document Released: 07/26/2002 Document Revised: 01/02/2016 Document Reviewed: 09/09/2014 Elsevier Interactive Patient Education  [employer].

## 2017-09-18 ENCOUNTER — Telehealth: Payer: Self-pay

## 2017-09-18 ENCOUNTER — Other Ambulatory Visit: Payer: Self-pay

## 2017-09-18 MED ORDER — ASPIRIN EC 81 MG PO TBEC: 81.0000 mg | DELAYED_RELEASE_TABLET | Freq: Every day | ORAL | 3 refills | Status: AC

## 2017-09-18 NOTE — Telephone Encounter (Signed)
The patient called in to let me know his ASA was in epic at the 325mg  does and he takes the 81 mg  Dose OTC

## 2017-09-18 NOTE — Progress Notes (Unsigned)
The patient called in to state he takes ASA 81 mg every day. Edited med and noted 81 mg dose

## 2017-11-14 ENCOUNTER — Encounter: Payer: Self-pay | Admitting: Family Medicine

## 2017-11-14 ENCOUNTER — Ambulatory Visit (INDEPENDENT_AMBULATORY_CARE_PROVIDER_SITE_OTHER): Payer: Medicare Other | Admitting: Family Medicine

## 2017-11-14 VITALS — BP 118/76 | HR 69 | Temp 98.1°F | Ht 65.0 in | Wt 170.6 lb

## 2017-11-14 DIAGNOSIS — H6121 Impacted cerumen, right ear: Secondary | ICD-10-CM

## 2017-11-14 NOTE — Progress Notes (Signed)
   Subjective:    Patient ID: Peter Bond, male    DOB: 1945/11/09, 72 y.o.   MRN: [id number]  HPI Here for 3 days of decreased hearing in the right ear, popping in the ear, and slight dizziness. No ear pain or sinus congestion.    Review of Systems  Constitutional: Negative.   HENT: Positive for hearing loss. Negative for congestion, ear discharge, ear pain, sinus pressure, sinus pain and sore throat.   Eyes: Negative.   Respiratory: Negative.   Cardiovascular: Negative.   Neurological: Positive for dizziness.       Objective:   Physical Exam  Constitutional: He appears well-developed and well-nourished.  HENT:  Left Ear: External ear normal.  Nose: Nose normal.  Mouth/Throat: Oropharynx is clear and moist.  Right ear canal is full of cerumen   Eyes: Conjunctivae are normal.  Neck: No thyromegaly present.  Cardiovascular: Normal rate, regular rhythm, normal heart sounds and intact distal pulses.  Pulmonary/Chest: Effort normal and breath sounds normal. No respiratory distress. He has no wheezes. He has no rales.  Lymphadenopathy:    He has no cervical adenopathy.          Assessment & Plan:  Cerumen impaction. This was irrigated clear with water.  Alysia Penna, MD

## 2018-01-27 ENCOUNTER — Encounter: Payer: Self-pay | Admitting: Family Medicine

## 2018-01-27 ENCOUNTER — Ambulatory Visit (INDEPENDENT_AMBULATORY_CARE_PROVIDER_SITE_OTHER): Payer: Medicare Other | Admitting: Family Medicine

## 2018-01-27 VITALS — BP 122/68 | HR 65 | Temp 97.9°F | Ht 65.0 in | Wt 173.0 lb

## 2018-01-27 DIAGNOSIS — S7012XA Contusion of left thigh, initial encounter: Secondary | ICD-10-CM | POA: Diagnosis not present

## 2018-01-27 DIAGNOSIS — S5001XA Contusion of right elbow, initial encounter: Secondary | ICD-10-CM | POA: Diagnosis not present

## 2018-01-27 DIAGNOSIS — S0083XA Contusion of other part of head, initial encounter: Secondary | ICD-10-CM

## 2018-01-27 NOTE — Progress Notes (Signed)
   Subjective:    Patient ID: Peter Bond, male    DOB: [birth year], 72 y.o.   MRN: [id number]  HPI Here to check on injuries from a fall at home on 01-24-18. He was on an 8 foot step ladder trimming shrubbery when the ladder tipped over, causing him to fall backwards onto his concrete driveway. He did strike the back of his head but there was no LOC. He has felt fine since then with no headache or dizziness or nausea or blurred vision. He also struck the driveway with his right elbow and left thigh. He has mild soreness in both areas but no real pain. After he fell he was able to get back up and finish trimming. He has taken a few Advils this week but nothing today.    Review of Systems  Constitutional: Negative.   Respiratory: Negative.   Cardiovascular: Negative.   Musculoskeletal: Positive for arthralgias.  Neurological: Negative.        Objective:   Physical Exam  Constitutional: He is oriented to person, place, and time. He appears well-developed and well-nourished. No distress.  HENT:  Head: Normocephalic and atraumatic.  Eyes: Pupils are equal, round, and reactive to light. Conjunctivae and EOM are normal.  Neck: Normal range of motion. Neck supple.  Cardiovascular: Normal rate, regular rhythm, normal heart sounds and intact distal pulses.  Pulmonary/Chest: Effort normal and breath sounds normal.  Musculoskeletal:  Mildly tender over the right olecranon, no swelling, full ROM. He is also mildly tender over the left lateral thigh, no swelling   Neurological: He is alert and oriented to person, place, and time. No cranial nerve deficit. He exhibits normal muscle tone. Coordination normal.          Assessment & Plan:  He had mild contusions to the head, the right elbow, and the left thigh as a result of a fall. He seems to be recovering quite well. Recheck prn.  Alysia Penna, MD

## 2018-05-21 DIAGNOSIS — H2511 Age-related nuclear cataract, right eye: Secondary | ICD-10-CM | POA: Diagnosis not present

## 2018-05-21 DIAGNOSIS — H40053 Ocular hypertension, bilateral: Secondary | ICD-10-CM | POA: Diagnosis not present

## 2018-05-21 DIAGNOSIS — Z961 Presence of intraocular lens: Secondary | ICD-10-CM | POA: Diagnosis not present

## 2018-05-21 DIAGNOSIS — H11003 Unspecified pterygium of eye, bilateral: Secondary | ICD-10-CM | POA: Diagnosis not present

## 2018-05-30 ENCOUNTER — Other Ambulatory Visit: Payer: Self-pay | Admitting: Family Medicine

## 2018-09-17 NOTE — Progress Notes (Addendum)
Subjective:   Peter Bond is a 73 y.o. male who presents for Medicare Annual/Subsequent preventive examination.  Review of Systems:  No ROS.  Medicare Wellness Visit. Additional risk factors are reflected in the social history.  Cardiac Risk Factors include: advanced age (>70men, >81 women);hypertension;male gender;dyslipidemia Sleep patterns: feels rested on waking, does not get up to void and sleeps 6 hours nightly.    Home Safety/Smoke Alarms: Feels safe in home. Smoke alarms in place.  Living environment; residence and Firearm Safety: 2-story house. No use or need for DME at this time. Modifications for home to prevent falls discussed.  Seat Belt Safety/Bike Helmet: Wears seat belt.   Male:   CCS-08/2014; due 05/2020. Pt. Will follow up with GI provider     PSA- will get rechecked likely at scheduled CPE with Dr. Sarajane Jews 2/5. Lab Results  Component Value Date   PSA 2.91 09/17/2017   PSA 3.57 08/09/2016   PSA 3.19 08/10/2015       Objective:    Vitals: BP 130/76 (BP Location: Right Arm, Patient Position: Sitting, Cuff Size: Normal)   Pulse 62   Temp 98 F (36.7 C)   Resp 16   Ht 5\' 5"  (1.651 m)   Wt 173 lb (78.5 kg)   SpO2 98%   BMI 28.79 kg/m   Body mass index is 28.79 kg/m.  Advanced Directives 09/18/2018 09/17/2017 08/09/2016 05/22/2015  Does Patient Have a Medical Advance Directive? Yes Yes Yes Yes  Type of Advance Directive Living will - Living will;Healthcare Power of Attorney Living will  Would patient like information on creating a medical advance directive? Yes (MAU/Ambulatory/Procedural Areas - Information given) - - -    Tobacco Social History   Tobacco Use  Smoking Status Never Smoker  Smokeless Tobacco Never Used     Counseling given: Not Answered   Past Medical History:  Diagnosis Date  . Cataract    1980's cataract removed  . GERD (gastroesophageal reflux disease)   . Glaucoma    left eye   . Hyperlipidemia    Past Surgical History:    Procedure Laterality Date  . CATARACT EXTRACTION     1980's  . COLONOSCOPY  06-19-15   per Dr. Havery Moros, adenomatous polyp and sigmoid diverticulosis, repeat in 5 yrs    . corneal implant    . PITUITARY EXCISION     pituitary gland removal   Family History  Problem Relation Age of Onset  . Prostate cancer Brother   . Stroke Other   . Prostate cancer Brother   . Colon cancer Neg Hx   . Esophageal cancer Neg Hx   . Rectal cancer Neg Hx   . Stomach cancer Neg Hx    Social History   Socioeconomic History  . Marital status: Married    Spouse name: Not on file  . Number of children: 1  . Years of education: Not on file  . Highest education level: Not on file  Occupational History    Comment: full time sales at [employer]  Social Needs  . Financial resource strain: Not hard at all  . Food insecurity:    Worry: Never true    Inability: Never true  . Transportation needs:    Medical: No    Non-medical: No  Tobacco Use  . Smoking status: Never Smoker  . Smokeless tobacco: Never Used  Substance and Sexual Activity  . Alcohol use: Yes    Alcohol/week: 1.0 standard drinks  Types: 1 Glasses of wine per week    Comment: rare  . Drug use: No  . Sexual activity: Yes  Lifestyle  . Physical activity:    Days per week: 7 days    Minutes per session: 30 min  . Stress: Not at all  Relationships  . Social connections:    Talks on phone: Three times a week    Gets together: Once a week    Attends religious service: Never    Active member of club or organization: No    Attends meetings of clubs or organizations: Never    Relationship status: Married  Other Topics Concern  . Not on file  Social History Narrative   09/18/2018: Lives with wife and adopted son in multi-level home.    Son is deaf, no significant stress as caregiver   Works out daily at home doing crunches, works full time still in [employer]. Does not want to retire.        Outpatient Encounter  Medications as of 09/18/2018  Medication Sig  . aspirin EC 81 MG tablet Take 1 tablet (81 mg total) by mouth daily.  Marland Kitchen atorvastatin (LIPITOR) 20 MG tablet TAKE 1 TABLET BY MOUTH ONCE DAILY  . dorzolamide-timolol (COSOPT) 22.3-6.8 MG/ML ophthalmic solution Place 22.3 drops into the left eye 2 (two) times daily. Dose is 22.3 -6.8 and instructions confirmed by the patient  . latanoprost (XALATAN) 0.005 % ophthalmic solution Place 0.005 drops into the left eye daily.   . Multiple Vitamin (MULTIVITAMIN) tablet Take 1 tablet by mouth daily.     No facility-administered encounter medications on file as of 09/18/2018.     Activities of Daily Living In your present state of health, do you have any difficulty performing the following activities: 09/18/2018  Hearing? N  Vision? N  Difficulty concentrating or making decisions? N  Walking or climbing stairs? N  Dressing or bathing? N  Doing errands, shopping? N  Preparing Food and eating ? N  Using the Toilet? N  In the past six months, have you accidently leaked urine? N  Do you have problems with loss of bowel control? N  Managing your Medications? N  Managing your Finances? N  Housekeeping or managing your Housekeeping? N  Some recent data might be hidden    Patient Care Team: Laurey Morale, MD as PCP - General Clent Jacks, MD as Consulting Physician (Ophthalmology) Kristeen Miss, MD as Consulting Physician (Gastroenterology)   Assessment:   This is a routine wellness examination for Peter Bond. Physical assessment deferred to PCP.   Exercise Activities and Dietary recommendations Current Exercise Habits: Home exercise routine, Type of exercise: calisthenics;stretching, Time (Minutes): 30, Frequency (Times/Week): 7, Weekly Exercise (Minutes/Week): 210, Intensity: Moderate, Exercise limited by: cardiac condition(s)  Diet (meal preparation, eat out, water intake, caffeinated beverages, dairy products, fruits and vegetables): well balanced, on  average, 3 meals per day. When asked, pt. Stated "I eat out a lot, but I try to avoid fried foods. I probably could drink more water". Pt. Advised on importance of staying hydrated to prevent falls, limit confusion, curb appetite, and feel energized overall.       Goals    . patient     Keep my exercise up;     Marland Kitchen Patient Stated     Will try to slow down Go to 5 days a week!    . Patient Stated     Keep exercise routine, and keep working!  Fall Risk Fall Risk  09/18/2018 09/17/2017 08/09/2016 02/14/2015  Falls in the past year? 1 No No No  Number falls in past yr: 0 - - -  Injury with Fall? 1 - - -  Risk for fall due to : History of fall(s);Impaired vision - - -  Follow up Falls prevention discussed;Education provided - - -    Depression Screen PHQ 2/9 Scores 09/18/2018 09/17/2017 08/09/2016 02/14/2015  PHQ - 2 Score 0 0 0 0  PHQ- 9 Score 0 - - -    Cognitive Function MMSE - Mini Mental State Exam 08/09/2016  Not completed: (No Data)       Ad8 score reviewed for issues:  Issues making decisions: no  Less interest in hobbies / activities: no  Repeats questions, stories (family complaining): no  Trouble using ordinary gadgets (microwave, computer, phone):no  Forgets the month or year: no  Mismanaging finances: no  Remembering appts: no  Daily problems with thinking and/or memory: no Ad8 score is= 0  Immunization History  Administered Date(s) Administered  . Influenza-Unspecified 07/19/2017, 06/19/2018  . Td 08/20/1999, 10/23/2009  . Tdap 09/17/2017    Qualifies for Shingles Vaccine? Yes, advised to receive at local pharmacy. Pamphlet on shingles itself provided to patient to review.   Screening Tests Health Maintenance  Topic Date Due  . Hepatitis C Screening  [birth year]  . PNA vac Low Risk Adult (1 of 2 - PCV13) 09/19/2019 (Originally 09/30/2010)  . COLONOSCOPY  06/18/2020  . TETANUS/TDAP  09/18/2027  . INFLUENZA VACCINE  Completed          Plan:    Review healthcare directive information with family; Bring a copy of any completed forms with you to your physical on 2/5.  Let us know if we can assist you at all in the conversation or decision-making process.  Refer to vocational resource provided to you for your son who is deaf: [facility], [place].  Consider getting shingles vaccine (shingrix), as well as pneumonia vaccine. Both have minimal side effects and will protect you from being hospitalized.   Make sure you follow up with your gastroenterologist later in the year for colonoscopy due October 2021.  Keep a good balance of work and rest! [employer] you today!  I have personally reviewed and noted the following in the patient's chart:   . Medical and social history . Use of alcohol, tobacco or illicit drugs  . Current medications and supplements . Functional ability and status . Nutritional status . Physical activity . Advanced directives . List of other physicians . Vitals . Screenings to include cognitive, depression, and falls . Referrals and appointments  In addition, I have reviewed and discussed with patient certain preventive protocols, quality metrics, and best practice recommendations. A written personalized care plan for preventive services as well as general preventive health recommendations were provided to patient.     Alphia Moh, RN  09/18/2018  I have read this note and agree with its contents.  Alysia Penna, MD

## 2018-09-18 ENCOUNTER — Ambulatory Visit (INDEPENDENT_AMBULATORY_CARE_PROVIDER_SITE_OTHER): Payer: Medicare Other

## 2018-09-18 ENCOUNTER — Telehealth: Payer: Self-pay

## 2018-09-18 VITALS — BP 130/76 | HR 62 | Temp 98.0°F | Resp 16 | Ht 65.0 in | Wt 173.0 lb

## 2018-09-18 DIAGNOSIS — Z0000 Encounter for general adult medical examination without abnormal findings: Secondary | ICD-10-CM

## 2018-09-18 NOTE — Patient Instructions (Addendum)
Review healthcare directive information with family; Bring a copy of any completed forms with you to your physical on 2/5.  Let us know if we can assist you at all in the conversation or decision-making process.  Will look into vocational and other resources for your son who is deaf.  Consider getting shingles vaccine (shingrix), as well as pneumonia vaccine. Both have minimal side effects and will protect you from being hospitalized.   Make sure you follow up with your gastroenterologist later in the year for colonoscopy due October 2021.  Keep a good balance of work and rest! [employer] you today!    Peter Bond , Thank you for taking time to come for your Medicare Wellness Visit. I appreciate your ongoing commitment to your health goals. Please review the following plan we discussed and let me know if I can assist you in the future.   These are the goals we discussed: Goals    . patient     Keep my exercise up;     Marland Kitchen Patient Stated     Will try to slow down Go to 5 days a week!    . Patient Stated     Keep exercise routine, and keep working!        This is a list of the screening recommended for you and due dates:  Health Maintenance  Topic Date Due  .  Hepatitis C: One time screening is recommended by Center for Disease Control  (CDC) for  adults born from [birth year] through 1965.   1946/01/15  . Pneumonia vaccines (1 of 2 - PCV13) 09/19/2019*  . Colon Cancer Screening  06/18/2020  . Tetanus Vaccine  09/18/2027  . Flu Shot  Completed  *Topic was postponed. The date shown is not the original due date.     Fall Prevention in the Home, Adult Falls can cause injuries. They can happen to people of all ages. There are many things you can do to make your home safe and to help prevent falls. Ask for help when making these changes, if needed. What actions can I take to prevent falls? General Instructions  Use good lighting in all rooms. Replace any light bulbs that burn  out.  Turn on the lights when you go into a dark area. Use night-lights.  Keep items that you use often in easy-to-reach places. Lower the shelves around your home if necessary.  Set up your furniture so you have a clear path. Avoid moving your furniture around.  Do not have throw rugs and other things on the floor that can make you trip.  Avoid walking on wet floors.  If any of your floors are uneven, fix them.  Add color or contrast paint or tape to clearly mark and help you see: ? Any grab bars or handrails. ? First and last steps of stairways. ? Where the edge of each step is.  If you use a stepladder: ? Make sure that it is fully opened. Do not climb a closed stepladder. ? Make sure that both sides of the stepladder are locked into place. ? Ask someone to hold the stepladder for you while you use it.  If there are any pets around you, be aware of where they are. What can I do in the bathroom?      Keep the floor dry. Clean up any water that spills onto the floor as soon as it happens.  Remove soap buildup in the tub or shower regularly.  Use non-skid mats or decals on the floor of the tub or shower.  Attach bath mats securely with double-sided, non-slip rug tape.  If you need to sit down in the shower, use a plastic, non-slip stool.  Install grab bars by the toilet and in the tub and shower. Do not use towel bars as grab bars. What can I do in the bedroom?  Make sure that you have a light by your bed that is easy to reach.  Do not use any sheets or blankets that are too big for your bed. They should not hang down onto the floor.  Have a firm chair that has side arms. You can use this for support while you get dressed. What can I do in the kitchen?  Clean up any spills right away.  If you need to reach something above you, use a strong step stool that has a grab bar.  Keep electrical cords out of the way.  Do not use floor polish or wax that makes floors  slippery. If you must use wax, use non-skid floor wax. What can I do with my stairs?  Do not leave any items on the stairs.  Make sure that you have a light switch at the top of the stairs and the bottom of the stairs. If you do not have them, ask someone to add them for you.  Make sure that there are handrails on both sides of the stairs, and use them. Fix handrails that are broken or loose. Make sure that handrails are as long as the stairways.  Install non-slip stair treads on all stairs in your home.  Avoid having throw rugs at the top or bottom of the stairs. If you do have throw rugs, attach them to the floor with carpet tape.  Choose a carpet that does not hide the edge of the steps on the stairway.  Check any carpeting to make sure that it is firmly attached to the stairs. Fix any carpet that is loose or worn. What can I do on the outside of my home?  Use bright outdoor lighting.  Regularly fix the edges of walkways and driveways and fix any cracks.  Remove anything that might make you trip as you walk through a door, such as a raised step or threshold.  Trim any bushes or trees on the path to your home.  Regularly check to see if handrails are loose or broken. Make sure that both sides of any steps have handrails.  Install guardrails along the edges of any raised decks and porches.  Clear walking paths of anything that might make someone trip, such as tools or rocks.  Have any leaves, snow, or ice cleared regularly.  Use sand or salt on walking paths during winter.  Clean up any spills in your garage right away. This includes grease or oil spills. What other actions can I take?  Wear shoes that: ? Have a low heel. Do not wear high heels. ? Have rubber bottoms. ? Are comfortable and fit you well. ? Are closed at the toe. Do not wear open-toe sandals.  Use tools that help you move around (mobility aids) if they are needed. These  include: ? Canes. ? Walkers. ? Scooters. ? Crutches.  Review your medicines with your doctor. Some medicines can make you feel dizzy. This can increase your chance of falling. Ask your doctor what other things you can do to help prevent falls. Where to find more information  Centers  for Disease Control and Prevention, STEADI: https://garcia.biz/  [employer] on Aging: BrainJudge.co.uk Contact a doctor if:  You are afraid of falling at home.  You feel weak, drowsy, or dizzy at home.  You fall at home. Summary  There are many simple things that you can do to make your home safe and to help prevent falls.  Ways to make your home safe include removing tripping hazards and installing grab bars in the bathroom.  Ask for help when making these changes in your home. This information is not intended to replace advice given to you by your health care provider. Make sure you discuss any questions you have with your health care provider. Document Released: 06/01/2009 Document Revised: 03/20/2017 Document Reviewed: 03/20/2017 Elsevier Interactive Patient Education  2019 [place] Maintenance, Male A healthy lifestyle and preventive care is important for your health and wellness. Ask your health care provider about what schedule of regular examinations is right for you. What should I know about weight and diet? Eat a Healthy Diet  Eat plenty of vegetables, fruits, whole grains, low-fat dairy products, and lean protein.  Do not eat a lot of foods high in solid fats, added sugars, or salt.  Maintain a Healthy Weight Regular exercise can help you achieve or maintain a healthy weight. You should:  Do at least 150 minutes of exercise each week. The exercise should increase your heart rate and make you sweat (moderate-intensity exercise).  Do strength-training exercises at least twice a week. Watch Your Levels of Cholesterol and Blood Lipids  Have your blood  tested for lipids and cholesterol every 5 years starting at 73 years of age. If you are at high risk for heart disease, you should start having your blood tested when you are 73 years old. You may need to have your cholesterol levels checked more often if: ? Your lipid or cholesterol levels are high. ? You are older than 73 years of age. ? You are at high risk for heart disease. What should I know about cancer screening? Many types of cancers can be detected early and may often be prevented. Lung Cancer  You should be screened every year for lung cancer if: ? You are a current smoker who has smoked for at least 30 years. ? You are a former smoker who has quit within the past 15 years.  Talk to your health care provider about your screening options, when you should start screening, and how often you should be screened. Colorectal Cancer  Routine colorectal cancer screening usually begins at 73 years of age and should be repeated every 5-10 years until you are 73 years old. You may need to be screened more often if early forms of precancerous polyps or small growths are found. Your health care provider may recommend screening at an earlier age if you have risk factors for colon cancer.  Your health care provider may recommend using home test kits to check for hidden blood in the stool.  A small camera at the end of a tube can be used to examine your colon (sigmoidoscopy or colonoscopy). This checks for the earliest forms of colorectal cancer. Prostate and Testicular Cancer  Depending on your age and overall health, your health care provider may do certain tests to screen for prostate and testicular cancer.  Talk to your health care provider about any symptoms or concerns you have about testicular or prostate cancer. Skin Cancer  Check your skin from head to toe  regularly.  Tell your health care provider about any new moles or changes in moles, especially if: ? There is a change in a  mole's size, shape, or color. ? You have a mole that is larger than a pencil eraser.  Always use sunscreen. Apply sunscreen liberally and repeat throughout the day.  Protect yourself by wearing long sleeves, pants, a wide-brimmed hat, and sunglasses when outside. What should I know about heart disease, diabetes, and high blood pressure?  If you are 27-61 years of age, have your blood pressure checked every 3-5 years. If you are 29 years of age or older, have your blood pressure checked every year. You should have your blood pressure measured twice-once when you are at a hospital or clinic, and once when you are not at a hospital or clinic. Record the average of the two measurements. To check your blood pressure when you are not at a hospital or clinic, you can use: ? An automated blood pressure machine at a pharmacy. ? A home blood pressure monitor.  Talk to your health care provider about your target blood pressure.  If you are between 37-36 years old, ask your health care provider if you should take aspirin to prevent heart disease.  Have regular diabetes screenings by checking your fasting blood sugar level. ? If you are at a normal weight and have a low risk for diabetes, have this test once every three years after the age of 71. ? If you are overweight and have a high risk for diabetes, consider being tested at a younger age or more often.  A one-time screening for abdominal aortic aneurysm (AAA) by ultrasound is recommended for men aged 51-75 years who are current or former smokers. What should I know about preventing infection? Hepatitis B If you have a higher risk for hepatitis B, you should be screened for this virus. Talk with your health care provider to find out if you are at risk for hepatitis B infection. Hepatitis C Blood testing is recommended for:  Everyone born from [birth year] through 1965.  Anyone with known risk factors for hepatitis C. Sexually Transmitted Diseases  (STDs)  You should be screened each year for STDs including gonorrhea and chlamydia if: ? You are sexually active and are younger than 73 years of age. ? You are older than 73 years of age and your health care provider tells you that you are at risk for this type of infection. ? Your sexual activity has changed since you were last screened and you are at an increased risk for chlamydia or gonorrhea. Ask your health care provider if you are at risk.  Talk with your health care provider about whether you are at high risk of being infected with HIV. Your health care provider may recommend a prescription medicine to help prevent HIV infection. What else can I do?  Schedule regular health, dental, and eye exams.  Stay current with your vaccines (immunizations).  Do not use any tobacco products, such as cigarettes, chewing tobacco, and e-cigarettes. If you need help quitting, ask your health care provider.  Limit alcohol intake to no more than 2 drinks per day. One drink equals 12 ounces of beer, 5 ounces of wine, or 1 ounces of hard liquor.  Do not use street drugs.  Do not share needles.  Ask your health care provider for help if you need support or information about quitting drugs.  Tell your health care provider if you often feel depressed.  Tell your health care provider if you have ever been abused or do not feel safe at home. This information is not intended to replace advice given to you by your health care provider. Make sure you discuss any questions you have with your health care provider. Document Released: 02/01/2008 Document Revised: 04/03/2016 Document Reviewed: 05/09/2015 Elsevier Interactive Patient Education  2019 [employer].

## 2018-09-18 NOTE — Telephone Encounter (Signed)
Error, refer to awv note.

## 2018-09-23 ENCOUNTER — Encounter: Payer: Self-pay | Admitting: Family Medicine

## 2018-09-23 ENCOUNTER — Ambulatory Visit (INDEPENDENT_AMBULATORY_CARE_PROVIDER_SITE_OTHER): Payer: Medicare Other | Admitting: Family Medicine

## 2018-09-23 VITALS — BP 126/80 | HR 68 | Temp 98.3°F | Ht 66.0 in | Wt 174.0 lb

## 2018-09-23 DIAGNOSIS — N138 Other obstructive and reflux uropathy: Secondary | ICD-10-CM | POA: Diagnosis not present

## 2018-09-23 DIAGNOSIS — Z23 Encounter for immunization: Secondary | ICD-10-CM | POA: Diagnosis not present

## 2018-09-23 DIAGNOSIS — E785 Hyperlipidemia, unspecified: Secondary | ICD-10-CM | POA: Diagnosis not present

## 2018-09-23 DIAGNOSIS — R030 Elevated blood-pressure reading, without diagnosis of hypertension: Secondary | ICD-10-CM | POA: Diagnosis not present

## 2018-09-23 DIAGNOSIS — K219 Gastro-esophageal reflux disease without esophagitis: Secondary | ICD-10-CM | POA: Diagnosis not present

## 2018-09-23 DIAGNOSIS — N401 Enlarged prostate with lower urinary tract symptoms: Secondary | ICD-10-CM | POA: Diagnosis not present

## 2018-09-23 LAB — HEPATIC FUNCTION PANEL
ALT: 19 U/L (ref 0–53)
AST: 15 U/L (ref 0–37)
Albumin: 4.5 g/dL (ref 3.5–5.2)
Alkaline Phosphatase: 77 U/L (ref 39–117)
BILIRUBIN TOTAL: 0.8 mg/dL (ref 0.2–1.2)
Bilirubin, Direct: 0.2 mg/dL (ref 0.0–0.3)
TOTAL PROTEIN: 6.7 g/dL (ref 6.0–8.3)

## 2018-09-23 LAB — BASIC METABOLIC PANEL
BUN: 13 mg/dL (ref 6–23)
CALCIUM: 9.3 mg/dL (ref 8.4–10.5)
CO2: 32 mEq/L (ref 19–32)
CREATININE: 1.33 mg/dL (ref 0.40–1.50)
Chloride: 104 mEq/L (ref 96–112)
GFR: 63.78 mL/min (ref >60.00)
GLUCOSE: 80 mg/dL (ref 70–99)
Potassium: 4.7 mEq/L (ref 3.5–5.1)
SODIUM: 142 meq/L (ref 135–145)

## 2018-09-23 LAB — CBC WITH DIFFERENTIAL/PLATELET
Basophils Absolute: 0 10*3/uL (ref 0.0–0.1)
Basophils Relative: 0.6 % (ref 0.0–3.0)
EOS ABS: 0.1 10*3/uL (ref 0.0–0.7)
Eosinophils Relative: 3.4 % (ref 0.0–5.0)
HCT: 44.1 % (ref 39.0–52.0)
HEMOGLOBIN: 14.8 g/dL (ref 13.0–17.0)
Lymphocytes Relative: 39 % (ref 12.0–46.0)
Lymphs Abs: 1.4 10*3/uL (ref 0.7–4.0)
MCHC: 33.5 g/dL (ref 30.0–36.0)
MCV: 86.9 fl (ref 78.0–100.0)
MONO ABS: 0.4 10*3/uL (ref 0.1–1.0)
Monocytes Relative: 11.6 % (ref 3.0–12.0)
Neutro Abs: 1.6 10*3/uL (ref 1.4–7.7)
Neutrophils Relative %: 45.4 % (ref 43.0–77.0)
Platelets: 198 10*3/uL (ref 150.0–400.0)
RBC: 5.07 Mil/uL (ref 4.22–5.81)
RDW: 13.7 % (ref 11.5–15.5)
WBC: 3.5 10*3/uL — AB (ref 4.0–10.5)

## 2018-09-23 LAB — TSH: TSH: 0.8 u[IU]/mL (ref 0.35–4.50)

## 2018-09-23 LAB — LIPID PANEL
Cholesterol: 173 mg/dL (ref 0–200)
HDL: 51 mg/dL (ref >39.00)
LDL CALC: 102 mg/dL — AB (ref 0–99)
NonHDL: 122.13
TRIGLYCERIDES: 100 mg/dL (ref 0.0–149.0)
Total CHOL/HDL Ratio: 3
VLDL: 20 mg/dL (ref 0.0–40.0)

## 2018-09-23 LAB — PSA: PSA: 3.13 ng/mL (ref 0.10–4.00)

## 2018-09-23 NOTE — Progress Notes (Signed)
   Subjective:    Patient ID: Peter Bond, male    DOB: [birth year], 73 y.o.   MRN: [id number]  HPI Here to follow up on issues. He feels great. His GERD is controlled with diet. His BP is stable. He still works full time.    Review of Systems  Constitutional: Negative.   HENT: Negative.   Eyes: Negative.   Respiratory: Negative.   Cardiovascular: Negative.   Gastrointestinal: Negative.   Genitourinary: Negative.   Musculoskeletal: Negative.   Skin: Negative.   Neurological: Negative.   Psychiatric/Behavioral: Negative.        Objective:   Physical Exam Constitutional:      General: He is not in acute distress.    Appearance: He is well-developed. He is not diaphoretic.  HENT:     Head: Normocephalic and atraumatic.     Right Ear: External ear normal.     Left Ear: External ear normal.     Nose: Nose normal.     Mouth/Throat:     Pharynx: No oropharyngeal exudate.  Eyes:     General: No scleral icterus.       Right eye: No discharge.        Left eye: No discharge.     Conjunctiva/sclera: Conjunctivae normal.     Pupils: Pupils are equal, round, and reactive to light.  Neck:     Musculoskeletal: Neck supple.     Thyroid: No thyromegaly.     Vascular: No JVD.     Trachea: No tracheal deviation.  Cardiovascular:     Rate and Rhythm: Normal rate and regular rhythm.     Heart sounds: Normal heart sounds. No murmur. No friction rub. No gallop.   Pulmonary:     Effort: Pulmonary effort is normal. No respiratory distress.     Breath sounds: Normal breath sounds. No wheezing or rales.  Chest:     Chest wall: No tenderness.  Abdominal:     General: Bowel sounds are normal. There is no distension.     Palpations: Abdomen is soft. There is no mass.     Tenderness: There is no abdominal tenderness. There is no guarding or rebound.  Genitourinary:    Penis: Normal. No tenderness.      Prostate: Normal.     Rectum: Normal. Guaiac result negative.  Musculoskeletal:  Normal range of motion.        General: No tenderness.  Lymphadenopathy:     Cervical: No cervical adenopathy.  Skin:    General: Skin is warm and dry.     Coloration: Skin is not pale.     Findings: No erythema or rash.  Neurological:     Mental Status: He is alert and oriented to person, place, and time.     Cranial Nerves: No cranial nerve deficit.     Motor: No abnormal muscle tone.     Coordination: Coordination normal.     Deep Tendon Reflexes: Reflexes are normal and symmetric. Reflexes normal.  Psychiatric:        Behavior: Behavior normal.        Thought Content: Thought content normal.        Judgment: Judgment normal.           Assessment & Plan:  His BP and GERD are stable. Get fasting labs to check lipids, etc. Given a Prevnar 13 shot.  Alysia Penna, MD

## 2018-11-03 DIAGNOSIS — R6889 Other general symptoms and signs: Secondary | ICD-10-CM | POA: Diagnosis not present

## 2019-02-05 ENCOUNTER — Other Ambulatory Visit: Payer: Self-pay | Admitting: Family Medicine

## 2019-03-01 DIAGNOSIS — Z1159 Encounter for screening for other viral diseases: Secondary | ICD-10-CM | POA: Diagnosis not present

## 2019-06-11 DIAGNOSIS — H40051 Ocular hypertension, right eye: Secondary | ICD-10-CM | POA: Diagnosis not present

## 2019-06-11 DIAGNOSIS — H11003 Unspecified pterygium of eye, bilateral: Secondary | ICD-10-CM | POA: Diagnosis not present

## 2019-06-11 DIAGNOSIS — H2511 Age-related nuclear cataract, right eye: Secondary | ICD-10-CM | POA: Diagnosis not present

## 2019-06-11 DIAGNOSIS — Z961 Presence of intraocular lens: Secondary | ICD-10-CM | POA: Diagnosis not present

## 2019-06-11 DIAGNOSIS — H401122 Primary open-angle glaucoma, left eye, moderate stage: Secondary | ICD-10-CM | POA: Diagnosis not present

## 2019-06-18 DIAGNOSIS — H2511 Age-related nuclear cataract, right eye: Secondary | ICD-10-CM | POA: Diagnosis not present

## 2019-06-18 DIAGNOSIS — H40051 Ocular hypertension, right eye: Secondary | ICD-10-CM | POA: Diagnosis not present

## 2019-06-18 DIAGNOSIS — Z961 Presence of intraocular lens: Secondary | ICD-10-CM | POA: Diagnosis not present

## 2019-06-18 DIAGNOSIS — H401122 Primary open-angle glaucoma, left eye, moderate stage: Secondary | ICD-10-CM | POA: Diagnosis not present

## 2019-07-08 DIAGNOSIS — H401122 Primary open-angle glaucoma, left eye, moderate stage: Secondary | ICD-10-CM | POA: Diagnosis not present

## 2019-09-13 ENCOUNTER — Other Ambulatory Visit: Payer: Self-pay

## 2019-09-13 ENCOUNTER — Ambulatory Visit (INDEPENDENT_AMBULATORY_CARE_PROVIDER_SITE_OTHER): Payer: Medicare Other | Admitting: Family Medicine

## 2019-09-13 ENCOUNTER — Encounter: Payer: Self-pay | Admitting: Family Medicine

## 2019-09-13 ENCOUNTER — Other Ambulatory Visit (INDEPENDENT_AMBULATORY_CARE_PROVIDER_SITE_OTHER): Payer: Medicare Other

## 2019-09-13 VITALS — BP 120/60 | HR 76 | Temp 97.7°F | Wt 170.2 lb

## 2019-09-13 DIAGNOSIS — R519 Headache, unspecified: Secondary | ICD-10-CM

## 2019-09-13 LAB — CBC WITH DIFFERENTIAL/PLATELET
Basophils Absolute: 0 10*3/uL (ref 0.0–0.1)
Basophils Relative: 0.7 % (ref 0.0–3.0)
Eosinophils Absolute: 0.1 10*3/uL (ref 0.0–0.7)
Eosinophils Relative: 3.1 % (ref 0.0–5.0)
HCT: 45.2 % (ref 39.0–52.0)
Hemoglobin: 14.9 g/dL (ref 13.0–17.0)
Lymphocytes Relative: 33.5 % (ref 12.0–46.0)
Lymphs Abs: 1.5 10*3/uL (ref 0.7–4.0)
MCHC: 32.9 g/dL (ref 30.0–36.0)
MCV: 87.5 fl (ref 78.0–100.0)
Monocytes Absolute: 0.7 10*3/uL (ref 0.1–1.0)
Monocytes Relative: 14.4 % — ABNORMAL HIGH (ref 3.0–12.0)
Neutro Abs: 2.2 10*3/uL (ref 1.4–7.7)
Neutrophils Relative %: 48.3 % (ref 43.0–77.0)
Platelets: 185 10*3/uL (ref 150.0–400.0)
RBC: 5.17 Mil/uL (ref 4.22–5.81)
RDW: 13.2 % (ref 11.5–15.5)
WBC: 4.6 10*3/uL (ref 4.0–10.5)

## 2019-09-13 LAB — BASIC METABOLIC PANEL
BUN: 20 mg/dL (ref 6–23)
CO2: 28 mEq/L (ref 19–32)
Calcium: 9.3 mg/dL (ref 8.4–10.5)
Chloride: 103 mEq/L (ref 96–112)
Creatinine, Ser: 1.36 mg/dL (ref 0.40–1.50)
GFR: 61.99 mL/min (ref >60.00)
Glucose, Bld: 88 mg/dL (ref 70–99)
Potassium: 4.4 mEq/L (ref 3.5–5.1)
Sodium: 139 mEq/L (ref 135–145)

## 2019-09-13 NOTE — Progress Notes (Signed)
   Subjective:    Patient ID: Peter Bond, male    DOB: [birth year], 74 y.o.   MRN: [id number]  HPI Here for frequent headaches in the past 4 weeks. Prior to this he rarely ever got headaches. These are mild but irritating. They are dull and not sharp or throbbing. They mostly involves the top of the head. No double vision or dizziness, no other neurologic deficits. Bright lights do not affect them. He has been checking his BP at home, and this has been averaging 120s to 130s over 80s. He is followed by Dr. Berline Lopes for glaucoma, and he recently underwent a successful surgery for this. His pressures are down and he has been able to stop all his eye drops. He has been followed by Dr. Ellene Route for a pituitary mass with serial brain MRI scans. His last one was on 05-15-17 and this showed the mass to be stable at a size of 0.8 x 0.7 x 1.1 cm.    Review of Systems  Constitutional: Negative.   Respiratory: Negative.   Cardiovascular: Negative.   Neurological: Positive for headaches. Negative for dizziness, tremors, seizures, syncope, facial asymmetry, speech difficulty, weakness, light-headedness and numbness.       Objective:   Physical Exam Constitutional:      Appearance: Normal appearance.  Cardiovascular:     Rate and Rhythm: Normal rate and regular rhythm.     Pulses: Normal pulses.     Heart sounds: Normal heart sounds.  Pulmonary:     Effort: Pulmonary effort is normal.     Breath sounds: Normal breath sounds.  Neurological:     General: No focal deficit present.     Mental Status: He is alert and oriented to person, place, and time. Mental status is at baseline.           Assessment & Plan:  Headaches. I assured him that his BP is not the cause of these. We will set up another contrasted brain MRI to investigate the headaches and to follow the pituitary mass. He can use Ibuprofen as needed.  Alysia Penna, MD

## 2019-09-27 ENCOUNTER — Ambulatory Visit (INDEPENDENT_AMBULATORY_CARE_PROVIDER_SITE_OTHER): Payer: Medicare Other | Admitting: Family Medicine

## 2019-09-27 ENCOUNTER — Other Ambulatory Visit: Payer: Self-pay

## 2019-09-27 ENCOUNTER — Encounter: Payer: Self-pay | Admitting: Family Medicine

## 2019-09-27 VITALS — BP 140/70 | Temp 97.7°F | Ht 66.0 in | Wt 170.0 lb

## 2019-09-27 DIAGNOSIS — Z23 Encounter for immunization: Secondary | ICD-10-CM | POA: Diagnosis not present

## 2019-09-27 DIAGNOSIS — N401 Enlarged prostate with lower urinary tract symptoms: Secondary | ICD-10-CM | POA: Diagnosis not present

## 2019-09-27 DIAGNOSIS — K219 Gastro-esophageal reflux disease without esophagitis: Secondary | ICD-10-CM

## 2019-09-27 DIAGNOSIS — E785 Hyperlipidemia, unspecified: Secondary | ICD-10-CM

## 2019-09-27 DIAGNOSIS — R030 Elevated blood-pressure reading, without diagnosis of hypertension: Secondary | ICD-10-CM | POA: Diagnosis not present

## 2019-09-27 DIAGNOSIS — E236 Other disorders of pituitary gland: Secondary | ICD-10-CM

## 2019-09-27 DIAGNOSIS — N528 Other male erectile dysfunction: Secondary | ICD-10-CM | POA: Diagnosis not present

## 2019-09-27 DIAGNOSIS — N138 Other obstructive and reflux uropathy: Secondary | ICD-10-CM | POA: Diagnosis not present

## 2019-09-27 DIAGNOSIS — R972 Elevated prostate specific antigen [PSA]: Secondary | ICD-10-CM

## 2019-09-27 LAB — LIPID PANEL
Cholesterol: 196 mg/dL (ref 0–200)
HDL: 61.3 mg/dL (ref >39.00)
LDL Cholesterol: 119 mg/dL — ABNORMAL HIGH (ref 0–99)
NonHDL: 135.04
Total CHOL/HDL Ratio: 3
Triglycerides: 80 mg/dL (ref 0.0–149.0)
VLDL: 16 mg/dL (ref 0.0–40.0)

## 2019-09-27 LAB — HEPATIC FUNCTION PANEL
ALT: 20 U/L (ref 0–53)
AST: 19 U/L (ref 0–37)
Albumin: 4.6 g/dL (ref 3.5–5.2)
Alkaline Phosphatase: 83 U/L (ref 39–117)
Bilirubin, Direct: 0.1 mg/dL (ref 0.0–0.3)
Total Bilirubin: 0.7 mg/dL (ref 0.2–1.2)
Total Protein: 7.2 g/dL (ref 6.0–8.3)

## 2019-09-27 LAB — TSH: TSH: 0.84 u[IU]/mL (ref 0.35–4.50)

## 2019-09-27 LAB — PSA: PSA: 4.46 ng/mL — ABNORMAL HIGH (ref 0.10–4.00)

## 2019-09-27 MED ORDER — ATORVASTATIN CALCIUM 20 MG PO TABS: 20.0000 mg | ORAL_TABLET | Freq: Every day | ORAL | 3 refills | Status: DC

## 2019-09-27 NOTE — Progress Notes (Signed)
Subjective:    Patient ID: Peter Bond, male    DOB: 10-21-1945, 74 y.o.   MRN: [id number]  HPI Here to follow up on issues. We met recently about his frequent headaches, and he is scheduled for a brain MRI on 10-12-19. The headaches have actually improved, and they are less intense and less frequent. Otherwise he feels fine.    Review of Systems  Constitutional: Negative.   HENT: Negative.   Eyes: Negative.   Respiratory: Negative.   Cardiovascular: Negative.   Gastrointestinal: Negative.   Genitourinary: Negative.   Musculoskeletal: Negative.   Skin: Negative.   Neurological: Positive for headaches.  Psychiatric/Behavioral: Negative.        Objective:   Physical Exam Constitutional:      General: He is not in acute distress.    Appearance: He is well-developed. He is not diaphoretic.  HENT:     Head: Normocephalic and atraumatic.     Right Ear: External ear normal.     Left Ear: External ear normal.     Nose: Nose normal.     Mouth/Throat:     Pharynx: No oropharyngeal exudate.  Eyes:     General: No scleral icterus.       Right eye: No discharge.        Left eye: No discharge.     Conjunctiva/sclera: Conjunctivae normal.     Pupils: Pupils are equal, round, and reactive to light.  Neck:     Thyroid: No thyromegaly.     Vascular: No JVD.     Trachea: No tracheal deviation.  Cardiovascular:     Rate and Rhythm: Normal rate and regular rhythm.     Heart sounds: Normal heart sounds. No murmur. No friction rub. No gallop.   Pulmonary:     Effort: Pulmonary effort is normal. No respiratory distress.     Breath sounds: Normal breath sounds. No wheezing or rales.  Chest:     Chest wall: No tenderness.  Abdominal:     General: Bowel sounds are normal. There is no distension.     Palpations: Abdomen is soft. There is no mass.     Tenderness: There is no abdominal tenderness. There is no guarding or rebound.  Genitourinary:    Penis: Normal. No tenderness.    Testes: Normal.     Prostate: Normal.     Rectum: Normal. Guaiac result negative.  Musculoskeletal:        General: No tenderness. Normal range of motion.     Cervical back: Neck supple.  Lymphadenopathy:     Cervical: No cervical adenopathy.  Skin:    General: Skin is warm and dry.     Coloration: Skin is not pale.     Findings: No erythema or rash.  Neurological:     Mental Status: He is alert and oriented to person, place, and time.     Cranial Nerves: No cranial nerve deficit.     Motor: No abnormal muscle tone.     Coordination: Coordination normal.     Deep Tendon Reflexes: Reflexes are normal and symmetric. Reflexes normal.  Psychiatric:        Behavior: Behavior normal.        Thought Content: Thought content normal.        Judgment: Judgment normal.           Assessment & Plan:  His headaches have improved but we await the results of the MRI, given that he had a pituitary mass  seen in 2018. His BP is stable. His GERD is controlled by diet. We will send him for fasting labs to check lipids, etc. Given a flu shot and a pneumococcal 23 vaccine.  Alysia Penna, MD

## 2019-09-27 NOTE — Addendum Note (Signed)
Addended by: Rebecca Eaton on: 09/27/2019 11:43 AM   Modules accepted: Orders

## 2019-10-01 NOTE — Addendum Note (Signed)
Addended by: Alysia Penna A on: 10/01/2019 07:48 AM   Modules accepted: Orders

## 2019-10-11 ENCOUNTER — Other Ambulatory Visit: Payer: Self-pay | Admitting: Family Medicine

## 2019-10-12 ENCOUNTER — Ambulatory Visit
Admission: RE | Admit: 2019-10-12 | Discharge: 2019-10-12 | Disposition: A | Discharge status: Home / Self Care | Payer: Medicare Other | Source: Ambulatory Visit | Attending: Family Medicine | Admitting: Family Medicine

## 2019-10-12 ENCOUNTER — Other Ambulatory Visit: Payer: Self-pay

## 2019-10-12 DIAGNOSIS — R9089 Other abnormal findings on diagnostic imaging of central nervous system: Secondary | ICD-10-CM | POA: Diagnosis not present

## 2019-10-12 DIAGNOSIS — G3189 Other specified degenerative diseases of nervous system: Secondary | ICD-10-CM | POA: Diagnosis not present

## 2019-10-12 DIAGNOSIS — Z961 Presence of intraocular lens: Secondary | ICD-10-CM | POA: Diagnosis not present

## 2019-10-12 DIAGNOSIS — R519 Headache, unspecified: Secondary | ICD-10-CM

## 2019-10-12 MED ORDER — GADOBENATE DIMEGLUMINE 529 MG/ML IV SOLN
16.0000 mL | Freq: Once | INTRAVENOUS | Status: AC | PRN
  Administered 2019-10-12: 16 mL via INTRAVENOUS

## 2019-10-27 ENCOUNTER — Other Ambulatory Visit: Payer: Self-pay | Admitting: Family Medicine

## 2019-12-23 ENCOUNTER — Other Ambulatory Visit: Payer: Self-pay

## 2019-12-24 ENCOUNTER — Ambulatory Visit (INDEPENDENT_AMBULATORY_CARE_PROVIDER_SITE_OTHER): Payer: Medicare Other | Admitting: Family Medicine

## 2019-12-24 ENCOUNTER — Encounter: Payer: Self-pay | Admitting: Family Medicine

## 2019-12-24 VITALS — BP 120/62 | HR 50 | Temp 98.3°F | Wt 172.4 lb

## 2019-12-24 DIAGNOSIS — S86811A Strain of other muscle(s) and tendon(s) at lower leg level, right leg, initial encounter: Secondary | ICD-10-CM | POA: Diagnosis not present

## 2019-12-24 NOTE — Progress Notes (Signed)
   Subjective:    Patient ID: Peter Bond, male    DOB: [birth year], 74 y.o.   MRN: [id number]  HPI Here for an injury to the right calf taht occurred while he was walking about 3 weeks ago. It never swelled. The pain comes and goes but overall he has improved a lot. He took Ibuprofen and applied ice when it first happened.    Review of Systems  Constitutional: Negative.   Respiratory: Negative.   Cardiovascular: Negative.   Musculoskeletal: Positive for myalgias.       Objective:   Physical Exam Constitutional:      General: He is not in acute distress.    Appearance: Normal appearance.     Comments: Gait is normal   Cardiovascular:     Rate and Rhythm: Normal rate and regular rhythm.     Pulses: Normal pulses.     Heart sounds: Normal heart sounds.  Pulmonary:     Effort: Pulmonary effort is normal.     Breath sounds: Normal breath sounds.  Musculoskeletal:     Comments: The right calf appears normal, no swelling. He is mildly tender in the proximal calf. No cords are felt   Neurological:     Mental Status: He is alert.           Assessment & Plan:  Calk strain. This seems to be healing as expected. I advised him to wear athletic shoes to work for the next 2 weeks to give added support. Recheck prn.  Alysia Penna, MD

## 2019-12-29 NOTE — Progress Notes (Signed)
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 He was referred by PCP to urologist.   Biopsies revealed:  12/09/2019   Past/Anticipated interventions by urology, if any: 12/09/2019 Dr. Harrell Gave Winter Transrectal ultrasound of prostate with biopsies  Referral to Dr. Gloriann Loan to discuss prostatectomy  Past/Anticipated interventions by medical oncology, if any: denies  Weight changes, if any: denies  Bowel/Bladder complaints, if any: IPSS 6. SHIM 23. Denies dysuria or hematuria. Denies urinary leakage or incontinence. Reports nocturia x 0-1. Reports urinary urgency all the time.    Nausea/Vomiting, if any: no   Pain issues, if any: no  SAFETY ISSUES:  Prior radiation? No  Pacemaker/ICD? No  Possible current pregnancy? N/A  Is the patient on methotrexate? No  Current Complaints / other details:  74 year old male. Married with one child.  His father and 3 of his younger brothers also had prostate cancer.

## 2019-12-31 ENCOUNTER — Encounter: Payer: Self-pay | Admitting: Radiation Oncology

## 2019-12-31 ENCOUNTER — Ambulatory Visit
Admission: RE | Admit: 2019-12-31 | Discharge: 2019-12-31 | Disposition: A | Discharge status: Home / Self Care | Payer: Medicare Other | Source: Ambulatory Visit | Attending: Radiation Oncology | Admitting: Radiation Oncology

## 2019-12-31 ENCOUNTER — Other Ambulatory Visit: Payer: Self-pay

## 2019-12-31 VITALS — BP 137/87 | HR 83 | Temp 98.0°F | Resp 20 | Ht 67.0 in | Wt 170.6 lb

## 2019-12-31 DIAGNOSIS — Z8041 Family history of malignant neoplasm of ovary: Secondary | ICD-10-CM | POA: Insufficient documentation

## 2019-12-31 DIAGNOSIS — C61 Malignant neoplasm of prostate: Secondary | ICD-10-CM

## 2019-12-31 DIAGNOSIS — Z79899 Other long term (current) drug therapy: Secondary | ICD-10-CM | POA: Diagnosis not present

## 2019-12-31 DIAGNOSIS — Z7982 Long term (current) use of aspirin: Secondary | ICD-10-CM | POA: Diagnosis not present

## 2019-12-31 DIAGNOSIS — K219 Gastro-esophageal reflux disease without esophagitis: Secondary | ICD-10-CM | POA: Diagnosis not present

## 2019-12-31 DIAGNOSIS — E785 Hyperlipidemia, unspecified: Secondary | ICD-10-CM | POA: Diagnosis not present

## 2019-12-31 HISTORY — DX: Malignant neoplasm of prostate: C61

## 2019-12-31 NOTE — Progress Notes (Signed)
See progress note under physician encounter. 

## 2019-12-31 NOTE — Progress Notes (Signed)
Radiation Oncology         [phone] ________________________________  Initial outpatient Consultation  Name: Peter Bond MRN: [id number]  Date: 12/31/2019  DOB: [birth year]  CC:Fry, Ishmael Holter, MD  Davis Gourd*   REFERRING PHYSICIAN: Davis Gourd*  DIAGNOSIS: 74 y.o. gentleman with Stage T1c adenocarcinoma of the prostate with Gleason score of 3+4, and PSA of 4.46.    ICD-10-CM   1. Malignant neoplasm of prostate ([place])  C61     HISTORY OF PRESENT ILLNESS: Peter Bond is a 74 y.o. male with a diagnosis of prostate cancer. He was noted to have an elevated PSA of 4.46 in 09/2019, by his primary care physician, Dr. Sarajane Jews.  Prior PSA values were 3.13 in 09/2018 and 2.19 in 08/2017. He also has a strong family history of prostate cancer in his father and 2 younger brothers.  Accordingly, he was referred for evaluation in urology by Dr. Lovena Neighbours on 11/02/2019,  digital rectal examination was performed at that time revealing no abnormalities.  The patient proceeded to transrectal ultrasound with 12 biopsies of the prostate on 12/09/2019.  The prostate volume measured 27.7 cc.  Out of 12 core biopsies, 5 were positive.  The maximum Gleason score was 3+4, and this was seen in the left apex lateral (with PNI). Additionally, Gleason 3+3 was seen in the right apex, right mid, left apex, and left base lateral.  The patient reviewed the biopsy results with his urologist and he has kindly been referred today for discussion of potential radiation treatment options. He is also scheduled for a consult with Dr. Gloriann Loan on 01/04/20 to discuss RALP.  PREVIOUS RADIATION THERAPY: No  PAST MEDICAL HISTORY:  Past Medical History:  Diagnosis Date  . Cataract    1980's cataract removed  . GERD (gastroesophageal reflux disease)   . Glaucoma    left eye   . Hyperlipidemia   . Prostate cancer ([place])       PAST SURGICAL HISTORY: Past Surgical History:  Procedure Laterality Date  . CATARACT  EXTRACTION     1980's  . COLONOSCOPY  06-19-15   per Dr. Havery Moros, adenomatous polyp and sigmoid diverticulosis, repeat in 5 yrs    . corneal implant    . PITUITARY EXCISION     pituitary gland removal  . PROSTATE BIOPSY      FAMILY HISTORY:  Family History  Problem Relation Age of Onset  . Prostate cancer Brother   . Prostate cancer Father   . Stroke Other   . Prostate cancer Brother   . Prostate cancer Brother   . Colon cancer Neg Hx   . Esophageal cancer Neg Hx   . Rectal cancer Neg Hx   . Stomach cancer Neg Hx   . Breast cancer Neg Hx     SOCIAL HISTORY:  Social History   Socioeconomic History  . Marital status: Married    Spouse name: Not on file  . Number of children: 1  . Years of education: Not on file  . Highest education level: Not on file  Occupational History    Comment: full time sales at [employer]  Tobacco Use  . Smoking status: Never Smoker  . Smokeless tobacco: Never Used  Substance and Sexual Activity  . Alcohol use: Yes    Alcohol/week: 1.0 standard drinks    Types: 1 Glasses of wine per week    Comment: rare  . Drug use: No  . Sexual activity: Yes  Other Topics Concern  .  Not on file  Social History Narrative   09/18/2018: Lives with wife and adopted son in multi-level home.    Son is deaf, no significant stress as caregiver   Works out daily at home doing crunches, works full time still in [employer]. Does not want to retire.       Social Determinants of Health   Financial Resource Strain:   . Difficulty of Paying Living Expenses:   Food Insecurity:   . Worried About Charity fundraiser in the Last Year:   . Arboriculturist in the Last Year:   Transportation Needs:   . Film/video editor (Medical):   Marland Kitchen Lack of Transportation (Non-Medical):   Physical Activity:   . Days of Exercise per Week:   . Minutes of Exercise per Session:   Stress:   . Feeling of Stress :   Social Connections:   . Frequency of Communication  with Friends and Family:   . Frequency of Social Gatherings with Friends and Family:   . Attends Religious Services:   . Active Member of Clubs or Organizations:   . Attends Archivist Meetings:   Marland Kitchen Marital Status:   Intimate Partner Violence:   . Fear of Current or Ex-Partner:   . Emotionally Abused:   Marland Kitchen Physically Abused:   . Sexually Abused:     ALLERGIES: Patient has no known allergies.  MEDICATIONS:  Current Outpatient Medications  Medication Sig Dispense Refill  . aspirin EC 81 MG tablet Take 1 tablet (81 mg total) by mouth daily. 30 tablet 3  . atorvastatin (LIPITOR) 20 MG tablet TAKE 1 TABLET BY MOUTH ONCE DAILY 90 tablet 3  . Multiple Vitamin (MULTIVITAMIN) tablet Take 1 tablet by mouth daily.       No current facility-administered medications for this encounter.    REVIEW OF SYSTEMS:  On review of systems, the patient reports that he is doing well overall. He denies any chest pain, shortness of breath, cough, fevers, chills, night sweats, unintended weight changes. He denies any bowel disturbances, and denies abdominal pain, nausea or vomiting. He denies any new musculoskeletal or joint aches or pains. His IPSS was 6, indicating mild urinary symptoms. He reports constant urinary urgency. His SHIM was 23, indicating he does not have erectile dysfunction. A complete review of systems is obtained and is otherwise negative.    PHYSICAL EXAM:  Wt Readings from Last 3 Encounters:  12/31/19 170 lb 9.6 oz (77.4 kg)  12/24/19 172 lb 6.4 oz (78.2 kg)  09/27/19 170 lb (77.1 kg)   Temp Readings from Last 3 Encounters:  12/31/19 98 F (36.7 C)  12/24/19 98.3 F (36.8 C) (Temporal)  09/27/19 97.7 F (36.5 C) (Temporal)   BP Readings from Last 3 Encounters:  12/31/19 137/87  12/24/19 120/62  09/27/19 140/70   Pulse Readings from Last 3 Encounters:  12/31/19 83  12/24/19 (!) 50  09/13/19 76   Pain Assessment Pain Score: 0-No pain/10  In general this is a  well appearing African American male in no acute distress. He is alert and oriented x4 and appropriate throughout the examination. HEENT reveals that the patient is normocephalic, atraumatic. EOMs are intact. PERRLA. Skin is intact without any evidence of gross lesions. Cardiovascular exam reveals a regular rate and rhythm, no clicks rubs or murmurs are auscultated. Chest is clear to auscultation bilaterally. Lymphatic assessment is performed and does not reveal any adenopathy in the cervical, supraclavicular, axillary, or inguinal chains. Abdomen  has active bowel sounds in all quadrants and is intact. The abdomen is soft, non tender, non distended. Lower extremities are negative for pretibial pitting edema, deep calf tenderness, cyanosis or clubbing.  KPS = 100  100 - Normal; no complaints; no evidence of disease. 90   - Able to carry on normal activity; minor signs or symptoms of disease. 80   - Normal activity with effort; some signs or symptoms of disease. 61   - Cares for self; unable to carry on normal activity or to do active work. 60   - Requires occasional assistance, but is able to care for most of his personal needs. 50   - Requires considerable assistance and frequent medical care. 39   - Disabled; requires special care and assistance. 69   - Severely disabled; hospital admission is indicated although death not imminent. 12   - Very sick; hospital admission necessary; active supportive treatment necessary. 10   - Moribund; fatal processes progressing rapidly. 0     - Dead  Karnofsky DA, Abelmann [place], Craver LS and [facility]al [facility] [phone]) The use of the nitrogen mustards in the palliative treatment of carcinoma: with particular reference to bronchogenic carcinoma Cancer 1 634-56  LABORATORY DATA:  Lab Results  Component Value Date   WBC 4.6 09/13/2019   HGB 14.9 09/13/2019   HCT 45.2 09/13/2019   MCV 87.5 09/13/2019   PLT 185.0 09/13/2019   Lab Results  Component Value Date   NA  139 09/13/2019   K 4.4 09/13/2019   CL 103 09/13/2019   CO2 28 09/13/2019   Lab Results  Component Value Date   ALT 20 09/27/2019   AST 19 09/27/2019   ALKPHOS 83 09/27/2019   BILITOT 0.7 09/27/2019     RADIOGRAPHY: No results found.    IMPRESSION/PLAN: 1. 74 y.o. gentleman with Stage T1c adenocarcinoma of the prostate with Gleason Score of 3+4, and PSA of 4.46. We discussed the patient's workup and outlined the nature of prostate cancer in this setting. The patient's T stage, Gleason's score, and PSA put him into the favorable intermediate risk group. Accordingly, he is eligible for a variety of potential treatment options including brachytherapy, 5.5 weeks of external radiation, or prostatectomy. We discussed the available radiation techniques, and focused on the details and logistics of delivery. We discussed and outlined the risks, benefits, short and long-term effects associated with radiotherapy and compared and contrasted these with prostatectomy. We discussed the role of SpaceOAR in reducing the rectal toxicity associated with radiotherapy. He was encouraged to ask questions that were answered to his stated satisfaction.  At the conclusion of our conversation, the patient remains undecided regarding his final treatment preference.  He appears to be leaning towards brachytherapy but is interested in learning more about surgery at his upcoming consult with Dr. Gloriann Loan on 01/04/20 and plans to reach a final decision shortly therafter.  We will share our discussion with Dr. Lovena Neighbours and Dr. Gloriann Loan and look forward to hearing back from the patient in the near future regarding his treatment decision.  He knows to call with any further questions regarding radiation and we will plan to reach out to him in the next 1-2 weeks to ascertain his preference and will help to coordinate treatment planning accordingly at that time. We enjoyed meeting him today and look forward to continuing to participate in his  care.    Nicholos Johns, PA-C    Tyler Pita, MD  [facility]  Direct Dial: [phone]  Fax: [phone] [email or web address]  Skype  [email or web address]   This document serves as a record of services personally performed by Tyler Pita, MD and Freeman Caldron, PA-C. It was created on their behalf by Wilburn Mylar, a trained medical scribe. The creation of this record is based on the scribe's personal observations and the provider's statements to them. This document has been checked and approved by the attending provider.

## 2020-01-03 DIAGNOSIS — Z961 Presence of intraocular lens: Secondary | ICD-10-CM | POA: Diagnosis not present

## 2020-01-03 DIAGNOSIS — Z9889 Other specified postprocedural states: Secondary | ICD-10-CM | POA: Diagnosis not present

## 2020-01-03 DIAGNOSIS — H40051 Ocular hypertension, right eye: Secondary | ICD-10-CM | POA: Diagnosis not present

## 2020-01-03 DIAGNOSIS — H401122 Primary open-angle glaucoma, left eye, moderate stage: Secondary | ICD-10-CM | POA: Diagnosis not present

## 2020-01-03 DIAGNOSIS — H2511 Age-related nuclear cataract, right eye: Secondary | ICD-10-CM | POA: Diagnosis not present

## 2020-01-03 DIAGNOSIS — C61 Malignant neoplasm of prostate: Secondary | ICD-10-CM | POA: Insufficient documentation

## 2020-01-07 ENCOUNTER — Encounter: Payer: Self-pay | Admitting: Medical Oncology

## 2020-01-07 NOTE — Progress Notes (Signed)
Spoke with patient to introduce myself as the prostate nurse navigator and discuss my role. I was unable to meet him 5/14, when he consulted with Dr. Tammi Klippel. He is leaning towards brachytherapy or surgery. He had a follow up visit with Dr. Gloriann Loan 5/18, but still undecided. He is hoping to make his decision within the next couple of weeks. I gave him my contact information and asked him to call me with questions or concerns. He voiced understanding.

## 2020-02-04 ENCOUNTER — Telehealth: Payer: Self-pay | Admitting: Medical Oncology

## 2020-02-04 ENCOUNTER — Encounter: Payer: Self-pay | Admitting: Medical Oncology

## 2020-02-04 NOTE — Progress Notes (Signed)
Spoke with patient regarding prostate treatment decision. He states he met with Dr. Gloriann Loan today and he has decided to have a repeat MRI and follow up with Dr. Gloriann Loan in 3 months. Pending results, he will decide on treatment. Ashlyn, PA notified of the above.

## 2020-02-04 NOTE — Telephone Encounter (Signed)
Opened in error

## 2020-02-07 ENCOUNTER — Other Ambulatory Visit: Payer: Self-pay | Admitting: Urology

## 2020-02-07 DIAGNOSIS — C61 Malignant neoplasm of prostate: Secondary | ICD-10-CM

## 2020-03-08 ENCOUNTER — Ambulatory Visit (INDEPENDENT_AMBULATORY_CARE_PROVIDER_SITE_OTHER): Payer: Medicare Other

## 2020-03-08 DIAGNOSIS — Z0000 Encounter for general adult medical examination without abnormal findings: Secondary | ICD-10-CM | POA: Diagnosis not present

## 2020-03-08 NOTE — Patient Instructions (Addendum)
Peter Bond , Thank you for taking time to come for your Medicare Wellness Visit. I appreciate your ongoing commitment to your health goals. Please review the following plan we discussed and let me know if I can assist you in the future.   Screening recommendations/referrals: Colonoscopy: Done 06/19/15 Recommended yearly ophthalmology/optometry visit for glaucoma screening and checkup Recommended yearly dental visit for hygiene and checkup  Vaccinations: Influenza vaccine: Up to date Pneumococcal vaccine: Up to dat Tdap vaccine: Up to date Shingles vaccine: Shingrix discussed. Please contact your pharmacy for coverage information.    Covid-19: Pt will bring in copy  Advanced directives: Please bring a copy of your health care power of attorney and living will to the office at your convenience.  Conditions/risks identified: Stay healthy  Next appointment: Follow up in one year for your annual wellness visit.  Preventive Care 65 Years and Older, Male Preventive care refers to lifestyle choices and visits with your health care provider that can promote health and wellness. What does preventive care include?  A yearly physical exam. This is also called an annual well check.  Dental exams once or twice a year.  Routine eye exams. Ask your health care provider how often you should have your eyes checked.  Personal lifestyle choices, including:  Daily care of your teeth and gums.  Regular physical activity.  Eating a healthy diet.  Avoiding tobacco and drug use.  Limiting alcohol use.  Practicing safe sex.  Taking low doses of aspirin every day.  Taking vitamin and mineral supplements as recommended by your health care provider. What happens during an annual well check? The services and screenings done by your health care provider during your annual well check will depend on your age, overall health, lifestyle risk factors, and family history of disease. Counseling  Your  health care provider may ask you questions about your:  Alcohol use.  Tobacco use.  Drug use.  Emotional well-being.  Home and relationship well-being.  Sexual activity.  Eating habits.  History of falls.  Memory and ability to understand (cognition).  Work and work Statistician. Screening  You may have the following tests or measurements:  Height, weight, and BMI.  Blood pressure.  Lipid and cholesterol levels. These may be checked every 5 years, or more frequently if you are over 7 years old.  Skin check.  Lung cancer screening. You may have this screening every year starting at age 44 if you have a 30-pack-year history of smoking and currently smoke or have quit within the past 15 years.  Fecal occult blood test (FOBT) of the stool. You may have this test every year starting at age 3.  Flexible sigmoidoscopy or colonoscopy. You may have a sigmoidoscopy every 5 years or a colonoscopy every 10 years starting at age 69.  Prostate cancer screening. Recommendations will vary depending on your family history and other risks.  Hepatitis C blood test.  Hepatitis B blood test.  Sexually transmitted disease (STD) testing.  Diabetes screening. This is done by checking your blood sugar (glucose) after you have not eaten for a while (fasting). You may have this done every 1-3 years.  Abdominal aortic aneurysm (AAA) screening. You may need this if you are a current or former smoker.  Osteoporosis. You may be screened starting at age 92 if you are at high risk. Talk with your health care provider about your test results, treatment options, and if necessary, the need for more tests. Vaccines  Your health care  provider may recommend certain vaccines, such as:  Influenza vaccine. This is recommended every year.  Tetanus, diphtheria, and acellular pertussis (Tdap, Td) vaccine. You may need a Td booster every 10 years.  Zoster vaccine. You may need this after age  45.  Pneumococcal 13-valent conjugate (PCV13) vaccine. One dose is recommended after age 91.  Pneumococcal polysaccharide (PPSV23) vaccine. One dose is recommended after age 68. Talk to your health care provider about which screenings and vaccines you need and how often you need them. This information is not intended to replace advice given to you by your health care provider. Make sure you discuss any questions you have with your health care provider. Document Released: 09/01/2015 Document Revised: 04/24/2016 Document Reviewed: 06/06/2015 Elsevier Interactive Patient Education  2017 [place] Prevention in the Home Falls can cause injuries. They can happen to people of all ages. There are many things you can do to make your home safe and to help prevent falls. What can I do on the outside of my home?  Regularly fix the edges of walkways and driveways and fix any cracks.  Remove anything that might make you trip as you walk through a door, such as a raised step or threshold.  Trim any bushes or trees on the path to your home.  Use bright outdoor lighting.  Clear any walking paths of anything that might make someone trip, such as rocks or tools.  Regularly check to see if handrails are loose or broken. Make sure that both sides of any steps have handrails.  Any raised decks and porches should have guardrails on the edges.  Have any leaves, snow, or ice cleared regularly.  Use sand or salt on walking paths during winter.  Clean up any spills in your garage right away. This includes oil or grease spills. What can I do in the bathroom?  Use night lights.  Install grab bars by the toilet and in the tub and shower. Do not use towel bars as grab bars.  Use non-skid mats or decals in the tub or shower.  If you need to sit down in the shower, use a plastic, non-slip stool.  Keep the floor dry. Clean up any water that spills on the floor as soon as it happens.  Remove  soap buildup in the tub or shower regularly.  Attach bath mats securely with double-sided non-slip rug tape.  Do not have throw rugs and other things on the floor that can make you trip. What can I do in the bedroom?  Use night lights.  Make sure that you have a light by your bed that is easy to reach.  Do not use any sheets or blankets that are too big for your bed. They should not hang down onto the floor.  Have a firm chair that has side arms. You can use this for support while you get dressed.  Do not have throw rugs and other things on the floor that can make you trip. What can I do in the kitchen?  Clean up any spills right away.  Avoid walking on wet floors.  Keep items that you use a lot in easy-to-reach places.  If you need to reach something above you, use a strong step stool that has a grab bar.  Keep electrical cords out of the way.  Do not use floor polish or wax that makes floors slippery. If you must use wax, use non-skid floor wax.  Do not have throw  rugs and other things on the floor that can make you trip. What can I do with my stairs?  Do not leave any items on the stairs.  Make sure that there are handrails on both sides of the stairs and use them. Fix handrails that are broken or loose. Make sure that handrails are as long as the stairways.  Check any carpeting to make sure that it is firmly attached to the stairs. Fix any carpet that is loose or worn.  Avoid having throw rugs at the top or bottom of the stairs. If you do have throw rugs, attach them to the floor with carpet tape.  Make sure that you have a light switch at the top of the stairs and the bottom of the stairs. If you do not have them, ask someone to add them for you. What else can I do to help prevent falls?  Wear shoes that:  Do not have high heels.  Have rubber bottoms.  Are comfortable and fit you well.  Are closed at the toe. Do not wear sandals.  If you use a  stepladder:  Make sure that it is fully opened. Do not climb a closed stepladder.  Make sure that both sides of the stepladder are locked into place.  Ask someone to hold it for you, if possible.  Clearly mark and make sure that you can see:  Any grab bars or handrails.  First and last steps.  Where the edge of each step is.  Use tools that help you move around (mobility aids) if they are needed. These include:  Canes.  Walkers.  Scooters.  Crutches.  Turn on the lights when you go into a dark area. Replace any light bulbs as soon as they burn out.  Set up your furniture so you have a clear path. Avoid moving your furniture around.  If any of your floors are uneven, fix them.  If there are any pets around you, be aware of where they are.  Review your medicines with your doctor. Some medicines can make you feel dizzy. This can increase your chance of falling. Ask your doctor what other things that you can do to help prevent falls. This information is not intended to replace advice given to you by your health care provider. Make sure you discuss any questions you have with your health care provider. Document Released: 06/01/2009 Document Revised: 01/11/2016 Document Reviewed: 09/09/2014 Elsevier Interactive Patient Education  2017 [employer].

## 2020-03-08 NOTE — Progress Notes (Signed)
Virtual Visit via Telephone Note  I connected with  Peter Bond on 03/08/20 at 11:15 AM EDT by telephone and verified that I am speaking with the correct person using two identifiers.  Medicare Annual Wellness visit completed telephonically due to Covid-19 pandemic.   Persons participating in this call: This Health Coach and this patient.   Location: Patient: Home Provider: Office   I discussed the limitations, risks, security and privacy concerns of performing an evaluation and management service by telephone and the availability of in person appointments. The patient expressed understanding and agreed to proceed.  Unable to perform video visit due to video visit attempted and failed and/or patient does not have video capability.   Some vital signs may be absent or patient reported.   Willette Brace, LPN    Subjective:   Peter Bond is a 74 y.o. male who presents for Medicare Annual/Subsequent preventive examination.  Review of Systems           Objective:    There were no vitals filed for this visit. There is no height or weight on file to calculate BMI.  Advanced Directives 12/31/2019 09/18/2018 09/17/2017 08/09/2016 05/22/2015  Does Patient Have a Medical Advance Directive? Yes Yes Yes Yes Yes  Type of Advance Directive Living will Living will - Living will;Healthcare Power of [place] will  Does patient want to make changes to medical advance directive? No - Patient declined - - - -  Would patient like information on creating a medical advance directive? - Yes (MAU/Ambulatory/Procedural Areas - Information given) - - -    Current Medications (verified) Outpatient Encounter Medications as of 03/08/2020  Medication Sig   aspirin EC 81 MG tablet Take 1 tablet (81 mg total) by mouth daily.   atorvastatin (LIPITOR) 20 MG tablet TAKE 1 TABLET BY MOUTH ONCE DAILY   Multiple Vitamin (MULTIVITAMIN) tablet Take 1 tablet by mouth daily.     No  facility-administered encounter medications on file as of 03/08/2020.    Allergies (verified) Patient has no known allergies.   History: Past Medical History:  Diagnosis Date   Cataract    1980's cataract removed   GERD (gastroesophageal reflux disease)    Glaucoma    left eye    Hyperlipidemia    Prostate cancer [facility])    Past Surgical History:  Procedure Laterality Date   CATARACT EXTRACTION     1980's   COLONOSCOPY  06-19-15   per Dr. Havery Moros, adenomatous polyp and sigmoid diverticulosis, repeat in 5 yrs     corneal implant     PITUITARY EXCISION     pituitary gland removal   PROSTATE BIOPSY     Family History  Problem Relation Age of Onset   Prostate cancer Brother    Prostate cancer Father    Stroke Other    Prostate cancer Brother    Prostate cancer Brother    Colon cancer Neg Hx    Esophageal cancer Neg Hx    Rectal cancer Neg Hx    Stomach cancer Neg Hx    Breast cancer Neg Hx    Social History   Socioeconomic History   Marital status: Married    Spouse name: Not on file   Number of children: 1   Years of education: Not on file   Highest education level: Not on file  Occupational History    Comment: full time sales at [employer]  Tobacco Use   Smoking status: Never Smoker  Smokeless tobacco: Never Used  Vaping Use   Vaping Use: Never used  Substance and Sexual Activity   Alcohol use: Yes    Alcohol/week: 1.0 standard drink    Types: 1 Glasses of wine per week    Comment: rare   Drug use: No   Sexual activity: Yes  Other Topics Concern   Not on file  Social History Narrative   09/18/2018: Lives with wife and adopted son in multi-level home.    Son is deaf, no significant stress as caregiver   Works out daily at home doing crunches, works full time still in [employer]. Does not want to retire.       Social Determinants of Health   Financial Resource Strain:    Difficulty of Paying Living Expenses:    Food Insecurity:    Worried About Charity fundraiser in the Last Year:    Arboriculturist in the Last Year:   Transportation Needs:    Film/video editor (Medical):    Lack of Transportation (Non-Medical):   Physical Activity:    Days of Exercise per Week:    Minutes of Exercise per Session:   Stress:    Feeling of Stress :   Social Connections:    Frequency of Communication with Friends and Family:    Frequency of Social Gatherings with Friends and Family:    Attends Religious Services:    Active Member of Clubs or Organizations:    Attends Archivist Meetings:    Marital Status:     Tobacco Counseling Counseling given: Not Answered   Clinical Intake:                 Diabetic?No         Activities of Daily Living No flowsheet data found.  Patient Care Team: Laurey Morale, MD as PCP - Evalina Field, MD as Consulting Physician (Ophthalmology) Kristeen Miss, MD as Consulting Physician (Gastroenterology) Cira Rue, RN Nurse Navigator as Registered Nurse (Medical Oncology)  Indicate any recent Medical Services you may have received from other than [facility] providers in the past year (date may be approximate).     Assessment:   This is a routine wellness examination for Peter Bond.  Hearing/Vision screen No exam data present  Dietary issues and exercise activities discussed:    Goals     patient     Keep my exercise up;      Patient Stated     Will try to slow down Go to 5 days a week!     Patient Stated     Keep exercise routine, and keep working!       Depression Screen PHQ 2/9 Scores 09/23/2018 09/18/2018 09/17/2017 08/09/2016 02/14/2015  PHQ - 2 Score 0 0 0 0 0  PHQ- 9 Score - 0 - - -    Fall Risk Fall Risk  09/23/2018 09/18/2018 09/17/2017 08/09/2016 02/14/2015  Falls in the past year? 0 1 No No No  Number falls in past yr: - 0 - - -  Injury with Fall? - 1 - - -  Risk for fall due to : - History of  fall(s);Impaired vision - - -  Follow up - Falls prevention discussed;Education provided - - -    Any stairs in or around the home? Yes  If so, are there any without handrails? No  Home free of loose throw rugs in walkways, pet beds, electrical cords, etc? Yes  Adequate lighting in  your home to reduce risk of falls? Yes   ASSISTIVE DEVICES UTILIZED TO PREVENT FALLS:  Life alert? No  Use of a cane, walker or w/c? No  Grab bars in the bathroom? No  Shower chair or bench in shower? No  Elevated toilet seat or a handicapped toilet? No   TIMED UP AND GO:  Was the test performed? No .    Cognitive Function: MMSE - Mini Mental State Exam 08/09/2016  Not completed: (No Data)        Immunizations Immunization History  Administered Date(s) Administered   Fluad Quad(high Dose 65+) 09/27/2019   Influenza-Unspecified 07/19/2017, 06/19/2018   Pneumococcal Conjugate-13 09/23/2018   Pneumococcal Polysaccharide-23 09/27/2019   Td 08/20/1999, 10/23/2009   Tdap 09/17/2017    TDAP status: Up to date Flu Vaccine status: Up to date Pneumococcal vaccine status: Up to date Covid-19 vaccine status: Completed vaccines  Qualifies for Shingles Vaccine? Yes   Zostavax completed No   Shingrix Completed?: No.    Education has been provided regarding the importance of this vaccine. Patient has been advised to call insurance company to determine out of pocket expense if they have not yet received this vaccine. Advised may also receive vaccine at local pharmacy or Health Dept. Verbalized acceptance and understanding.  Screening Tests Health Maintenance  Topic Date Due   Hepatitis C Screening  Never done   COVID-19 Vaccine (1) Never done   INFLUENZA VACCINE  03/19/2020   COLONOSCOPY  06/18/2020   TETANUS/TDAP  09/18/2027   PNA vac Low Risk Adult  Completed    Health Maintenance  Health Maintenance Due  Topic Date Due   Hepatitis C Screening  Never done   COVID-19 Vaccine  (1) Never done    Colorectal cancer screening: Completed 06/19/15. Repeat every 5 years    Additional Screening:  Hepatitis C Screening: does  Vision Screening: Recommended annual ophthalmology exams for early detection of glaucoma and other disorders of the eye. Is the patient up to date with their annual eye exam?  Yes  Who is the provider or what is the name of the office in which the patient attends annual eye exams? Dr. Katy Fitch   Dental Screening: Recommended annual dental exams for proper oral hygiene  Community Resource Referral / Chronic Care Management: CRR required this visit?  No   CCM required this visit?  No      Plan:     I have personally reviewed and noted the following in the patients chart:    Medical and social history  Use of alcohol, tobacco or illicit drugs   Current medications and supplements  Functional ability and status  Nutritional status  Physical activity  Advanced directives  List of other physicians  Hospitalizations, surgeries, and ER visits in previous 12 months  Vitals  Screenings to include cognitive, depression, and falls  Referrals and appointments  In addition, I have reviewed and discussed with patient certain preventive protocols, quality metrics, and best practice recommendations. A written personalized care plan for preventive services as well as general preventive health recommendations were provided to patient.     Willette Brace, LPN   05/02/7828   Nurse Notes: None

## 2020-03-21 ENCOUNTER — Other Ambulatory Visit: Payer: Self-pay

## 2020-03-21 ENCOUNTER — Ambulatory Visit (INDEPENDENT_AMBULATORY_CARE_PROVIDER_SITE_OTHER): Payer: Medicare Other | Admitting: Family Medicine

## 2020-03-21 ENCOUNTER — Encounter: Payer: Self-pay | Admitting: Family Medicine

## 2020-03-21 VITALS — BP 130/70 | HR 73 | Temp 97.7°F | Wt 170.0 lb

## 2020-03-21 DIAGNOSIS — L309 Dermatitis, unspecified: Secondary | ICD-10-CM

## 2020-03-21 MED ORDER — TRIAMCINOLONE ACETONIDE 0.1 % EX CREA
1.0000 | TOPICAL_CREAM | Freq: Two times a day (BID) | CUTANEOUS | 0 refills | Status: DC
Start: 2020-03-21 — End: 2021-01-01

## 2020-03-21 NOTE — Progress Notes (Signed)
   Subjective:    Patient ID: Peter Bond, male    DOB: March 27, 1946, 74 y.o.   MRN: [id number]  HPI Here for 3 weeks of itching on both nipples, primarily at night. No visible rash. No other skin areas are involved. He has tried Desitin with no effect.    Review of Systems  Constitutional: Negative.   Respiratory: Negative.   Cardiovascular: Negative.        Objective:   Physical Exam Constitutional:      Appearance: Normal appearance.  Cardiovascular:     Rate and Rhythm: Normal rate and regular rhythm.     Pulses: Normal pulses.     Heart sounds: Normal heart sounds.  Pulmonary:     Effort: Pulmonary effort is normal.     Breath sounds: Normal breath sounds.  Skin:    Comments: The nipples and areolas appear normal   Neurological:     Mental Status: He is alert.           Assessment & Plan:  Possible eczema, try Triamcinolone cream prn.  Alysia Penna, MD

## 2020-04-28 ENCOUNTER — Ambulatory Visit
Admission: RE | Admit: 2020-04-28 | Discharge: 2020-04-28 | Disposition: A | Discharge status: Home / Self Care | Payer: Medicare Other | Source: Ambulatory Visit | Attending: Urology | Admitting: Urology

## 2020-04-28 ENCOUNTER — Other Ambulatory Visit: Payer: Self-pay

## 2020-04-28 DIAGNOSIS — C61 Malignant neoplasm of prostate: Secondary | ICD-10-CM

## 2020-04-28 MED ORDER — GADOBENATE DIMEGLUMINE 529 MG/ML IV SOLN
17.0000 mL | Freq: Once | INTRAVENOUS | Status: AC | PRN
  Administered 2020-04-28: 17 mL via INTRAVENOUS

## 2020-05-01 ENCOUNTER — Other Ambulatory Visit: Payer: Self-pay | Admitting: Neurological Surgery

## 2020-05-01 DIAGNOSIS — D352 Benign neoplasm of pituitary gland: Secondary | ICD-10-CM

## 2020-05-02 DIAGNOSIS — H11003 Unspecified pterygium of eye, bilateral: Secondary | ICD-10-CM | POA: Diagnosis not present

## 2020-05-02 DIAGNOSIS — H401122 Primary open-angle glaucoma, left eye, moderate stage: Secondary | ICD-10-CM | POA: Diagnosis not present

## 2020-05-02 DIAGNOSIS — H2511 Age-related nuclear cataract, right eye: Secondary | ICD-10-CM | POA: Diagnosis not present

## 2020-05-02 DIAGNOSIS — H40051 Ocular hypertension, right eye: Secondary | ICD-10-CM | POA: Diagnosis not present

## 2020-05-02 DIAGNOSIS — Z9889 Other specified postprocedural states: Secondary | ICD-10-CM | POA: Diagnosis not present

## 2020-05-22 ENCOUNTER — Ambulatory Visit
Admission: RE | Admit: 2020-05-22 | Discharge: 2020-05-22 | Disposition: A | Discharge status: Home / Self Care | Payer: Medicare Other | Source: Ambulatory Visit | Attending: Neurological Surgery | Admitting: Neurological Surgery

## 2020-05-22 DIAGNOSIS — D352 Benign neoplasm of pituitary gland: Secondary | ICD-10-CM

## 2020-05-22 MED ORDER — GADOBENATE DIMEGLUMINE 529 MG/ML IV SOLN
8.0000 mL | Freq: Once | INTRAVENOUS | Status: AC | PRN
  Administered 2020-05-22: 8 mL via INTRAVENOUS

## 2020-05-26 DIAGNOSIS — D352 Benign neoplasm of pituitary gland: Secondary | ICD-10-CM | POA: Diagnosis not present

## 2020-06-08 ENCOUNTER — Encounter: Payer: Self-pay | Admitting: Urology

## 2020-06-08 NOTE — Progress Notes (Signed)
Per most recent OV with Dr. Gloriann Loan in 04/2020, the patient has elected to continue in observation at this time.  Nicholos Johns, MMS, PA-C [facility] [place]: [phone]  Fax: [phone]

## 2020-07-11 ENCOUNTER — Telehealth (INDEPENDENT_AMBULATORY_CARE_PROVIDER_SITE_OTHER): Payer: Medicare Other | Admitting: Family Medicine

## 2020-07-11 ENCOUNTER — Encounter: Payer: Self-pay | Admitting: Family Medicine

## 2020-07-11 VITALS — Ht 67.0 in | Wt 173.0 lb

## 2020-07-11 DIAGNOSIS — J069 Acute upper respiratory infection, unspecified: Secondary | ICD-10-CM | POA: Diagnosis not present

## 2020-07-11 MED ORDER — HYDROCODONE-HOMATROPINE 5-1.5 MG/5ML PO SYRP: 5.0000 mL | ORAL_SOLUTION | ORAL | 0 refills | Status: DC | PRN

## 2020-07-11 NOTE — Progress Notes (Signed)
Subjective:    Patient ID: Peter Bond, male    DOB: [birth year], 74 y.o.   MRN: [id number]  HPI Virtual Visit via Video Note  I connected with the patient on 07/11/20 at  1:45 PM EST by a video enabled telemedicine application and verified that I am speaking with the correct person using two identifiers.  Location patient: home Location provider:work or home office Persons participating in the virtual visit: patient, provider  I discussed the limitations of evaluation and management by telemedicine and the availability of in person appointments. The patient expressed understanding and agreed to proceed.   HPI: Here for 3 days of stuffy head and a dry cough. No SOB or fever. No body aches or ST. No NVD. Using Robitussin. Today he feels much better and he went to work.   ROS: See pertinent positives and negatives per HPI.  Past Medical History:  Diagnosis Date  . Cataract    1980's cataract removed  . GERD (gastroesophageal reflux disease)   . Glaucoma    left eye   . Hyperlipidemia   . Prostate cancer [facility])     Past Surgical History:  Procedure Laterality Date  . CATARACT EXTRACTION     1980's  . COLONOSCOPY  06-19-15   per Dr. Havery Moros, adenomatous polyp and sigmoid diverticulosis, repeat in 5 yrs    . corneal implant    . PITUITARY EXCISION     pituitary gland removal  . PROSTATE BIOPSY      Family History  Problem Relation Age of Onset  . Prostate cancer Brother   . Prostate cancer Father   . Stroke Other   . Prostate cancer Brother   . Prostate cancer Brother   . Colon cancer Neg Hx   . Esophageal cancer Neg Hx   . Rectal cancer Neg Hx   . Stomach cancer Neg Hx   . Breast cancer Neg Hx      Current Outpatient Medications:  .  aspirin EC 81 MG tablet, Take 1 tablet (81 mg total) by mouth daily., Disp: 30 tablet, Rfl: 3 .  atorvastatin (LIPITOR) 20 MG tablet, TAKE 1 TABLET BY MOUTH ONCE DAILY, Disp: 90 tablet, Rfl: 3 .  Multiple Vitamin  (MULTIVITAMIN) tablet, Take 1 tablet by mouth daily.  , Disp: , Rfl:  .  HYDROcodone-homatropine (HYCODAN) 5-1.5 MG/5ML syrup, Take 5 mLs by mouth every 4 (four) hours as needed for cough., Disp: 240 mL, Rfl: 0 .  triamcinolone cream (KENALOG) 0.1 %, Apply 1 application topically 2 (two) times daily. (Patient not taking: Reported on 07/11/2020), Disp: 45 g, Rfl: 0  EXAM:  VITALS per patient if applicable:  GENERAL: alert, oriented, appears well and in no acute distress  HEENT: atraumatic, conjunttiva clear, no obvious abnormalities on inspection of external nose and ears  NECK: normal movements of the head and neck  LUNGS: on inspection no signs of respiratory distress, breathing rate appears normal, no obvious gross SOB, gasping or wheezing  CV: no obvious cyanosis  MS: moves all visible extremities without noticeable abnormality  PSYCH/NEURO: pleasant and cooperative, no obvious depression or anxiety, speech and thought processing grossly intact  ASSESSMENT AND PLAN: Viral URI. Drink fluids. Recheck prn.  Alysia Penna, MD  Discussed the following assessment and plan:  No diagnosis found.     I discussed the assessment and treatment plan with the patient. The patient was provided an opportunity to ask questions and all were answered. The patient agreed with the plan and  demonstrated an understanding of the instructions.   The patient was advised to call back or seek an in-person evaluation if the symptoms worsen or if the condition fails to improve as anticipated.     Review of Systems     Objective:   Physical Exam        Assessment & Plan:

## 2020-10-02 ENCOUNTER — Encounter: Payer: Self-pay | Admitting: Gastroenterology

## 2020-10-18 ENCOUNTER — Encounter: Payer: Self-pay | Admitting: Gastroenterology

## 2020-10-27 DIAGNOSIS — Z961 Presence of intraocular lens: Secondary | ICD-10-CM | POA: Diagnosis not present

## 2020-10-27 DIAGNOSIS — Z9889 Other specified postprocedural states: Secondary | ICD-10-CM | POA: Diagnosis not present

## 2020-10-27 DIAGNOSIS — H40051 Ocular hypertension, right eye: Secondary | ICD-10-CM | POA: Diagnosis not present

## 2020-10-27 DIAGNOSIS — H2511 Age-related nuclear cataract, right eye: Secondary | ICD-10-CM | POA: Diagnosis not present

## 2020-10-27 DIAGNOSIS — H11003 Unspecified pterygium of eye, bilateral: Secondary | ICD-10-CM | POA: Diagnosis not present

## 2020-10-27 DIAGNOSIS — H401122 Primary open-angle glaucoma, left eye, moderate stage: Secondary | ICD-10-CM | POA: Diagnosis not present

## 2020-10-31 ENCOUNTER — Other Ambulatory Visit: Payer: Self-pay | Admitting: Family Medicine

## 2020-12-07 ENCOUNTER — Other Ambulatory Visit: Payer: Self-pay

## 2020-12-07 ENCOUNTER — Ambulatory Visit (AMBULATORY_SURGERY_CENTER): Payer: Self-pay

## 2020-12-07 VITALS — Ht 67.0 in | Wt 172.0 lb

## 2020-12-07 DIAGNOSIS — Z86010 Personal history of colonic polyps: Secondary | ICD-10-CM

## 2020-12-07 MED ORDER — PEG-KCL-NACL-NASULF-NA ASC-C 100 G PO SOLR: 1.0000 | Freq: Once | ORAL | 0 refills | Status: AC

## 2020-12-07 NOTE — Progress Notes (Signed)
No allergies to soy or egg Pt is not on blood thinners or diet pills Denies issues with sedation/intubation Denies atrial flutter/fib Denies constipation   Pt is aware of Covid safety and care partner requirements.      

## 2020-12-13 ENCOUNTER — Telehealth: Payer: Self-pay | Admitting: Gastroenterology

## 2020-12-13 DIAGNOSIS — Z86010 Personal history of colonic polyps: Secondary | ICD-10-CM

## 2020-12-13 MED ORDER — PEG 3350-KCL-NA BICARB-NACL 420 G PO SOLR: 4000.0000 mL | Freq: Once | ORAL | 0 refills | Status: AC

## 2020-12-13 NOTE — Telephone Encounter (Signed)
Golytely Rxprep sent to pt's pharmacy and new prep instructions sent to [place] and pt's email. Pt aware he will need to drink all of this prep medication.

## 2020-12-13 NOTE — Telephone Encounter (Signed)
Inbound call from patient. He is scheduled for procedure 12/20/2020. His insurance does not cover the prep that he is prescribed. Asked if there is an alternative he could use. Would like a call for when he can pick it up. Best contact number [phone]

## 2020-12-16 ENCOUNTER — Other Ambulatory Visit: Payer: Self-pay | Admitting: Family Medicine

## 2020-12-19 ENCOUNTER — Other Ambulatory Visit: Payer: Self-pay

## 2020-12-19 MED ORDER — ATORVASTATIN CALCIUM 20 MG PO TABS: 20.0000 mg | ORAL_TABLET | Freq: Every day | ORAL | 0 refills | Status: DC

## 2020-12-19 NOTE — Telephone Encounter (Signed)
LVM for patient to call to schedule CPE  °

## 2020-12-20 ENCOUNTER — Encounter: Payer: Self-pay | Admitting: Gastroenterology

## 2020-12-20 ENCOUNTER — Ambulatory Visit (AMBULATORY_SURGERY_CENTER): Payer: Medicare Other | Admitting: Gastroenterology

## 2020-12-20 ENCOUNTER — Other Ambulatory Visit: Payer: Self-pay

## 2020-12-20 VITALS — BP 116/70 | HR 59 | Temp 97.8°F | Resp 14 | Ht 67.0 in | Wt 172.0 lb

## 2020-12-20 DIAGNOSIS — Z86010 Personal history of colonic polyps: Secondary | ICD-10-CM | POA: Diagnosis not present

## 2020-12-20 DIAGNOSIS — Z1211 Encounter for screening for malignant neoplasm of colon: Secondary | ICD-10-CM | POA: Diagnosis not present

## 2020-12-20 HISTORY — PX: COLONOSCOPY: SHX174

## 2020-12-20 MED ORDER — SODIUM CHLORIDE 0.9 % IV SOLN
500.0000 mL | Freq: Once | INTRAVENOUS | Status: DC
Start: 2020-12-20 — End: 2020-12-20

## 2020-12-20 NOTE — Op Note (Signed)
[facility] Patient Name: Dave Mergen Procedure Date: 12/20/2020 7:18 AM MRN: [id number] Endoscopist: Remo Lipps P. Havery Moros , MD Age: 75 Referring MD:  Date of Birth: 02-26-1946 Gender: Male Account #: [id number] Procedure:                Colonoscopy Indications:              High risk colon cancer surveillance: Personal                            history of colonic polyps - history of adenoma 2016 Medicines:                Monitored Anesthesia Care Procedure:                Pre-Anesthesia Assessment:                           - Prior to the procedure, a History and Physical                            was performed, and patient medications and                            allergies were reviewed. The patient's tolerance of                            previous anesthesia was also reviewed. The risks                            and benefits of the procedure and the sedation                            options and risks were discussed with the patient.                            All questions were answered, and informed consent                            was obtained. Prior Anticoagulants: The patient has                            taken no previous anticoagulant or antiplatelet                            agents. ASA Grade Assessment: II - A patient with                            mild systemic disease. After reviewing the risks                            and benefits, the patient was deemed in                            satisfactory condition to undergo the procedure.  After obtaining informed consent, the colonoscope                            was passed under direct vision. Throughout the                            procedure, the patient's blood pressure, pulse, and                            oxygen saturations were monitored continuously. The                            Olympus CF-HQ190 [id number]) Colonoscope was                            introduced  through the anus and advanced to the the                            cecum, identified by appendiceal orifice and                            ileocecal valve. The colonoscopy was performed                            without difficulty. The patient tolerated the                            procedure well. The quality of the bowel                            preparation was good. The ileocecal valve,                            appendiceal orifice, and rectum were photographed. Scope In: 8:02:27 AM Scope Out: 8:18:40 AM Scope Withdrawal Time: 0 hours 13 minutes 28 seconds  Total Procedure Duration: 0 hours 16 minutes 13 seconds  Findings:                 The perianal and digital rectal examinations were                            normal.                           A single medium-sized angiodysplastic lesion was                            found in the cecum.                           A few small-mouthed diverticula were found in the                            sigmoid colon.  Internal hemorrhoids were found during                            retroflexion. The hemorrhoids were small.                           The exam was otherwise without abnormality. Complications:            No immediate complications. Estimated blood loss:                            None. Estimated Blood Loss:     Estimated blood loss: none. Impression:               - A single colonic angiodysplastic lesion.                           - Diverticulosis in the sigmoid colon.                           - Internal hemorrhoids.                           - The examination was otherwise normal.                           - No polyps. Recommendation:           - Patient has a contact number available for                            emergencies. The signs and symptoms of potential                            delayed complications were discussed with the                            patient. Return to normal  activities tomorrow.                            Written discharge instructions were provided to the                            patient.                           - Resume previous diet.                           - Continue present medications.                           - No further colonoscopy screening warranted due to                            age Carlota Raspberry. Havery Moros, MD 12/20/2020 8:23:27 AM This report has been signed electronically.

## 2020-12-20 NOTE — Progress Notes (Signed)
Pt's states no medical or surgical changes since previsit or office visit. 

## 2020-12-20 NOTE — Patient Instructions (Signed)
YOU HAD AN ENDOSCOPIC PROCEDURE TODAY AT THE [facility]:   Refer to the procedure report that was given to you for any specific questions about what was found during the examination.  If the procedure report does not answer your questions, please call your gastroenterologist to clarify.  If you requested that your care partner not be given the details of your procedure findings, then the procedure report has been included in a sealed envelope for you to review at your convenience later.  YOU SHOULD EXPECT: Some feelings of bloating in the abdomen. Passage of more gas than usual.  Walking can help get rid of the air that was put into your GI tract during the procedure and reduce the bloating. If you had a lower endoscopy (such as a colonoscopy or flexible sigmoidoscopy) you may notice spotting of blood in your stool or on the toilet paper. If you underwent a bowel prep for your procedure, you may not have a normal bowel movement for a few days.  Please Note:  You might notice some irritation and congestion in your nose or some drainage.  This is from the oxygen used during your procedure.  There is no need for concern and it should clear up in a day or so.  SYMPTOMS TO REPORT IMMEDIATELY:   Following lower endoscopy (colonoscopy or flexible sigmoidoscopy):  Excessive amounts of blood in the stool  Significant tenderness or worsening of abdominal pains  Swelling of the abdomen that is new, acute  Fever of 100F or higher   Following upper endoscopy (EGD)  Vomiting of blood or coffee ground material  New chest pain or pain under the shoulder blades  Painful or persistently difficult swallowing  New shortness of breath  Fever of 100F or higher  Black, tarry-looking stools  For urgent or emergent issues, a gastroenterologist can be reached at any hour by calling [phone]. Do not use MyChart messaging for urgent concerns.    DIET:  We do recommend a small meal at first, but  then you may proceed to your regular diet.  Drink plenty of fluids but you should avoid alcoholic beverages for 24 hours.  ACTIVITY:  You should plan to take it easy for the rest of today and you should NOT DRIVE or use heavy machinery until tomorrow (because of the sedation medicines used during the test).    FOLLOW UP: Our staff will call the number listed on your records 48-72 hours following your procedure to check on you and address any questions or concerns that you may have regarding the information given to you following your procedure. If we do not reach you, we will leave a message.  We will attempt to reach you two times.  During this call, we will ask if you have developed any symptoms of COVID 19. If you develop any symptoms (ie: fever, flu-like symptoms, shortness of breath, cough etc.) before then, please call [phone].  If you test positive for Covid 19 in the 2 weeks post procedure, please call and report this information to us.    If any biopsies were taken you will be contacted by phone or by letter within the next 1-3 weeks.  Please call us at [phone] if you have not heard about the biopsies in 3 weeks.    SIGNATURES/CONFIDENTIALITY: You and/or your care partner have signed paperwork which will be entered into your electronic medical record.  These signatures attest to the fact that that the information above on   your After Visit Summary has been reviewed and is understood.  Full responsibility of the confidentiality of this discharge information lies with you and/or your care-partner. 

## 2020-12-20 NOTE — Progress Notes (Signed)
To pacu, VSS. Report to [email or web address] °

## 2020-12-22 ENCOUNTER — Telehealth: Payer: Self-pay

## 2020-12-22 NOTE — Telephone Encounter (Signed)
  Follow up Call-  Call back number 12/20/2020  Post procedure Call Back phone  # [phone]  Permission to leave phone message Yes  Some recent data might be hidden     Patient questions:  Do you have a fever, pain , or abdominal swelling? No. Pain Score  0 *  Have you tolerated food without any problems? Yes.    Have you been able to return to your normal activities? Yes.    Do you have any questions about your discharge instructions: Diet   No. Medications  No. Follow up visit  No.  Do you have questions or concerns about your Care? No.  Actions: * If pain score is 4 or above: No action needed, pain <4. 1. Have you developed a fever since your procedure? no  2.   Have you had an respiratory symptoms (SOB or cough) since your procedure? no  3.   Have you tested positive for COVID 19 since your procedure no  4.   Have you had any family members/close contacts diagnosed with the COVID 19 since your procedure?  no   If yes to any of these questions please route to Joylene John, RN and Joella Prince, RN

## 2020-12-25 ENCOUNTER — Encounter: Payer: Medicare Other | Admitting: Family Medicine

## 2021-01-01 ENCOUNTER — Encounter: Payer: Self-pay | Admitting: Family Medicine

## 2021-01-01 ENCOUNTER — Other Ambulatory Visit: Payer: Self-pay

## 2021-01-01 ENCOUNTER — Ambulatory Visit (INDEPENDENT_AMBULATORY_CARE_PROVIDER_SITE_OTHER): Payer: Medicare Other | Admitting: Family Medicine

## 2021-01-01 VITALS — BP 138/80 | HR 62 | Temp 97.9°F | Ht 66.0 in | Wt 165.2 lb

## 2021-01-01 DIAGNOSIS — R030 Elevated blood-pressure reading, without diagnosis of hypertension: Secondary | ICD-10-CM

## 2021-01-01 DIAGNOSIS — N528 Other male erectile dysfunction: Secondary | ICD-10-CM | POA: Diagnosis not present

## 2021-01-01 DIAGNOSIS — K219 Gastro-esophageal reflux disease without esophagitis: Secondary | ICD-10-CM

## 2021-01-01 DIAGNOSIS — R739 Hyperglycemia, unspecified: Secondary | ICD-10-CM | POA: Diagnosis not present

## 2021-01-01 DIAGNOSIS — E785 Hyperlipidemia, unspecified: Secondary | ICD-10-CM | POA: Diagnosis not present

## 2021-01-01 DIAGNOSIS — C61 Malignant neoplasm of prostate: Secondary | ICD-10-CM

## 2021-01-01 LAB — BASIC METABOLIC PANEL
BUN: 19 mg/dL (ref 6–23)
CO2: 31 mEq/L (ref 19–32)
Calcium: 9.4 mg/dL (ref 8.4–10.5)
Chloride: 101 mEq/L (ref 96–112)
Creatinine, Ser: 1.46 mg/dL (ref 0.40–1.50)
GFR: 46.84 mL/min — ABNORMAL LOW (ref >60.00)
Glucose, Bld: 84 mg/dL (ref 70–99)
Potassium: 4.6 mEq/L (ref 3.5–5.1)
Sodium: 140 mEq/L (ref 135–145)

## 2021-01-01 LAB — LIPID PANEL
Cholesterol: 196 mg/dL (ref 0–200)
HDL: 62.2 mg/dL (ref >39.00)
LDL Cholesterol: 116 mg/dL — ABNORMAL HIGH (ref 0–99)
NonHDL: 133.54
Total CHOL/HDL Ratio: 3
Triglycerides: 87 mg/dL (ref 0.0–149.0)
VLDL: 17.4 mg/dL (ref 0.0–40.0)

## 2021-01-01 LAB — HEPATIC FUNCTION PANEL
ALT: 23 U/L (ref 0–53)
AST: 21 U/L (ref 0–37)
Albumin: 4.6 g/dL (ref 3.5–5.2)
Alkaline Phosphatase: 72 U/L (ref 39–117)
Bilirubin, Direct: 0.1 mg/dL (ref 0.0–0.3)
Total Bilirubin: 0.8 mg/dL (ref 0.2–1.2)
Total Protein: 7.2 g/dL (ref 6.0–8.3)

## 2021-01-01 LAB — CBC WITH DIFFERENTIAL/PLATELET
Basophils Absolute: 0 10*3/uL (ref 0.0–0.1)
Basophils Relative: 0.5 % (ref 0.0–3.0)
Eosinophils Absolute: 0.1 10*3/uL (ref 0.0–0.7)
Eosinophils Relative: 4.1 % (ref 0.0–5.0)
HCT: 46.4 % (ref 39.0–52.0)
Hemoglobin: 15.7 g/dL (ref 13.0–17.0)
Lymphocytes Relative: 43.6 % (ref 12.0–46.0)
Lymphs Abs: 1.6 10*3/uL (ref 0.7–4.0)
MCHC: 33.7 g/dL (ref 30.0–36.0)
MCV: 85.7 fl (ref 78.0–100.0)
Monocytes Absolute: 0.5 10*3/uL (ref 0.1–1.0)
Monocytes Relative: 14.4 % — ABNORMAL HIGH (ref 3.0–12.0)
Neutro Abs: 1.4 10*3/uL (ref 1.4–7.7)
Neutrophils Relative %: 37.4 % — ABNORMAL LOW (ref 43.0–77.0)
Platelets: 202 10*3/uL (ref 150.0–400.0)
RBC: 5.42 Mil/uL (ref 4.22–5.81)
RDW: 13.3 % (ref 11.5–15.5)
WBC: 3.7 10*3/uL — ABNORMAL LOW (ref 4.0–10.5)

## 2021-01-01 LAB — HEMOGLOBIN A1C: Hgb A1c MFr Bld: 5.9 % (ref 4.6–6.5)

## 2021-01-01 LAB — TSH: TSH: 0.82 u[IU]/mL (ref 0.35–4.50)

## 2021-01-01 LAB — T3, FREE: T3, Free: 3 pg/mL (ref 2.3–4.2)

## 2021-01-01 LAB — T4, FREE: Free T4: 0.72 ng/dL (ref 0.60–1.60)

## 2021-01-01 NOTE — Progress Notes (Signed)
Subjective:    Patient ID: Peter Bond, male    DOB: 1946-02-25, 75 y.o.   MRN: [id number]  HPI Here to follow up on issues. He feels well in general. He sees Dr. Gloriann Loan to follow an indolent prostate cancer, and they have agreed on a course of watchful waiting. He saw him in March, and he will see him again in September. His BP is stable at home. He watches his diet and exercises regularly.    Review of Systems  Constitutional: Negative.   HENT: Negative.   Eyes: Negative.   Respiratory: Negative.   Cardiovascular: Negative.   Gastrointestinal: Negative.   Genitourinary: Negative.   Musculoskeletal: Negative.   Skin: Negative.   Neurological: Negative.   Psychiatric/Behavioral: Negative.        Objective:   Physical Exam Constitutional:      General: He is not in acute distress.    Appearance: Normal appearance. He is well-developed. He is not diaphoretic.  HENT:     Head: Normocephalic and atraumatic.     Right Ear: External ear normal.     Left Ear: External ear normal.     Nose: Nose normal.     Mouth/Throat:     Pharynx: No oropharyngeal exudate.  Eyes:     General: No scleral icterus.       Right eye: No discharge.        Left eye: No discharge.     Conjunctiva/sclera: Conjunctivae normal.     Pupils: Pupils are equal, round, and reactive to light.  Neck:     Thyroid: No thyromegaly.     Vascular: No JVD.     Trachea: No tracheal deviation.  Cardiovascular:     Rate and Rhythm: Normal rate and regular rhythm.     Heart sounds: Normal heart sounds. No murmur heard. No friction rub. No gallop.   Pulmonary:     Effort: Pulmonary effort is normal. No respiratory distress.     Breath sounds: Normal breath sounds. No wheezing or rales.  Chest:     Chest wall: No tenderness.  Abdominal:     General: Bowel sounds are normal. There is no distension.     Palpations: Abdomen is soft. There is no mass.     Tenderness: There is no abdominal tenderness. There is no  guarding or rebound.  Genitourinary:    Penis: No tenderness.   Musculoskeletal:        General: No tenderness. Normal range of motion.     Cervical back: Neck supple.  Lymphadenopathy:     Cervical: No cervical adenopathy.  Skin:    General: Skin is warm and dry.     Coloration: Skin is not pale.     Findings: No erythema or rash.  Neurological:     Mental Status: He is alert and oriented to person, place, and time.     Cranial Nerves: No cranial nerve deficit.     Motor: No abnormal muscle tone.     Coordination: Coordination normal.     Deep Tendon Reflexes: Reflexes are normal and symmetric. Reflexes normal.  Psychiatric:        Behavior: Behavior normal.        Thought Content: Thought content normal.        Judgment: Judgment normal.           Assessment & Plan:  He seems to be doing well. His HTN is stable. He will follow up with Urology for the prostate  cancer. We will get fasting labs today to check lipids, etc. We spent 45 minutes together today to discuss these issues. Alysia Penna, MD

## 2021-01-04 MED ORDER — ATORVASTATIN CALCIUM 40 MG PO TABS
40.0000 mg | ORAL_TABLET | Freq: Every day | ORAL | 3 refills | Status: DC
Start: 2021-01-04 — End: 2021-07-02

## 2021-01-04 NOTE — Addendum Note (Signed)
Addended by: Agnes Lawrence on: 01/04/2021 09:05 AM   Modules accepted: Orders

## 2021-02-26 DIAGNOSIS — H40051 Ocular hypertension, right eye: Secondary | ICD-10-CM | POA: Diagnosis not present

## 2021-02-26 DIAGNOSIS — Z9889 Other specified postprocedural states: Secondary | ICD-10-CM | POA: Diagnosis not present

## 2021-02-26 DIAGNOSIS — Z961 Presence of intraocular lens: Secondary | ICD-10-CM | POA: Diagnosis not present

## 2021-02-26 DIAGNOSIS — H401122 Primary open-angle glaucoma, left eye, moderate stage: Secondary | ICD-10-CM | POA: Diagnosis not present

## 2021-02-26 DIAGNOSIS — H11003 Unspecified pterygium of eye, bilateral: Secondary | ICD-10-CM | POA: Diagnosis not present

## 2021-02-26 DIAGNOSIS — H2511 Age-related nuclear cataract, right eye: Secondary | ICD-10-CM | POA: Diagnosis not present

## 2021-03-04 ENCOUNTER — Other Ambulatory Visit: Payer: Self-pay | Admitting: Family Medicine

## 2021-03-14 ENCOUNTER — Other Ambulatory Visit: Payer: Self-pay

## 2021-03-14 ENCOUNTER — Ambulatory Visit (INDEPENDENT_AMBULATORY_CARE_PROVIDER_SITE_OTHER): Payer: Medicare Other

## 2021-03-14 DIAGNOSIS — Z0000 Encounter for general adult medical examination without abnormal findings: Secondary | ICD-10-CM

## 2021-03-14 NOTE — Patient Instructions (Signed)
Peter Bond , Thank you for taking time to come for your Medicare Wellness Visit. I appreciate your ongoing commitment to your health goals. Please review the following plan we discussed and let me know if I can assist you in the future.   Screening recommendations/referrals: Colonoscopy: Done 12/20/20 repeat in 5 years  Recommended yearly ophthalmology/optometry visit for glaucoma screening and checkup Recommended yearly dental visit for hygiene and checkup  Vaccinations: Influenza vaccine: Up to date Pneumococcal vaccine: Completed  Tdap vaccine: Done 09/17/17 repeat in 10 years  Shingles vaccine: Shingrix discussed. Please contact your pharmacy for coverage information.    Covid-19: Completed 6/16 & 02/25/20  Advanced directives: Please bring a copy of your health care power of attorney and living will to the office at your convenience.  Conditions/risks identified: stay healthy  Next appointment: Follow up in one year for your annual wellness visit.   Preventive Care 40 Years and Older, Male Preventive care refers to lifestyle choices and visits with your health care provider that can promote health and wellness. What does preventive care include? A yearly physical exam. This is also called an annual well check. Dental exams once or twice a year. Routine eye exams. Ask your health care provider how often you should have your eyes checked. Personal lifestyle choices, including: Daily care of your teeth and gums. Regular physical activity. Eating a healthy diet. Avoiding tobacco and drug use. Limiting alcohol use. Practicing safe sex. Taking low doses of aspirin every day. Taking vitamin and mineral supplements as recommended by your health care provider. What happens during an annual well check? The services and screenings done by your health care provider during your annual well check will depend on your age, overall health, lifestyle risk factors, and family history of  disease. Counseling  Your health care provider may ask you questions about your: Alcohol use. Tobacco use. Drug use. Emotional well-being. Home and relationship well-being. Sexual activity. Eating habits. History of falls. Memory and ability to understand (cognition). Work and work Statistician. Screening  You may have the following tests or measurements: Height, weight, and BMI. Blood pressure. Lipid and cholesterol levels. These may be checked every 5 years, or more frequently if you are over 90 years old. Skin check. Lung cancer screening. You may have this screening every year starting at age 72 if you have a 30-pack-year history of smoking and currently smoke or have quit within the past 15 years. Fecal occult blood test (FOBT) of the stool. You may have this test every year starting at age 50. Flexible sigmoidoscopy or colonoscopy. You may have a sigmoidoscopy every 5 years or a colonoscopy every 10 years starting at age 59. Prostate cancer screening. Recommendations will vary depending on your family history and other risks. Hepatitis C blood test. Hepatitis B blood test. Sexually transmitted disease (STD) testing. Diabetes screening. This is done by checking your blood sugar (glucose) after you have not eaten for a while (fasting). You may have this done every 1-3 years. Abdominal aortic aneurysm (AAA) screening. You may need this if you are a current or former smoker. Osteoporosis. You may be screened starting at age 76 if you are at high risk. Talk with your health care provider about your test results, treatment options, and if necessary, the need for more tests. Vaccines  Your health care provider may recommend certain vaccines, such as: Influenza vaccine. This is recommended every year. Tetanus, diphtheria, and acellular pertussis (Tdap, Td) vaccine. You may need a Td booster  every 10 years. Zoster vaccine. You may need this after age 24. Pneumococcal 13-valent  conjugate (PCV13) vaccine. One dose is recommended after age 57. Pneumococcal polysaccharide (PPSV23) vaccine. One dose is recommended after age 105. Talk to your health care provider about which screenings and vaccines you need and how often you need them. This information is not intended to replace advice given to you by your health care provider. Make sure you discuss any questions you have with your health care provider. Document Released: 09/01/2015 Document Revised: 04/24/2016 Document Reviewed: 06/06/2015 Elsevier Interactive Patient Education  2017 [employer] Prevention in the Home Falls can cause injuries. They can happen to people of all ages. There are many things you can do to make your home safe and to help prevent falls. What can I do on the outside of my home? Regularly fix the edges of walkways and driveways and fix any cracks. Remove anything that might make you trip as you walk through a door, such as a raised step or threshold. Trim any bushes or trees on the path to your home. Use bright outdoor lighting. Clear any walking paths of anything that might make someone trip, such as rocks or tools. Regularly check to see if handrails are loose or broken. Make sure that both sides of any steps have handrails. Any raised decks and porches should have guardrails on the edges. Have any leaves, snow, or ice cleared regularly. Use sand or salt on walking paths during winter. Clean up any spills in your garage right away. This includes oil or grease spills. What can I do in the bathroom? Use night lights. Install grab bars by the toilet and in the tub and shower. Do not use towel bars as grab bars. Use non-skid mats or decals in the tub or shower. If you need to sit down in the shower, use a plastic, non-slip stool. Keep the floor dry. Clean up any water that spills on the floor as soon as it happens. Remove soap buildup in the tub or shower regularly. Attach bath mats  securely with double-sided non-slip rug tape. Do not have throw rugs and other things on the floor that can make you trip. What can I do in the bedroom? Use night lights. Make sure that you have a light by your bed that is easy to reach. Do not use any sheets or blankets that are too big for your bed. They should not hang down onto the floor. Have a firm chair that has side arms. You can use this for support while you get dressed. Do not have throw rugs and other things on the floor that can make you trip. What can I do in the kitchen? Clean up any spills right away. Avoid walking on wet floors. Keep items that you use a lot in easy-to-reach places. If you need to reach something above you, use a strong step stool that has a grab bar. Keep electrical cords out of the way. Do not use floor polish or wax that makes floors slippery. If you must use wax, use non-skid floor wax. Do not have throw rugs and other things on the floor that can make you trip. What can I do with my stairs? Do not leave any items on the stairs. Make sure that there are handrails on both sides of the stairs and use them. Fix handrails that are broken or loose. Make sure that handrails are as long as the stairways. Check any carpeting to  make sure that it is firmly attached to the stairs. Fix any carpet that is loose or worn. Avoid having throw rugs at the top or bottom of the stairs. If you do have throw rugs, attach them to the floor with carpet tape. Make sure that you have a light switch at the top of the stairs and the bottom of the stairs. If you do not have them, ask someone to add them for you. What else can I do to help prevent falls? Wear shoes that: Do not have high heels. Have rubber bottoms. Are comfortable and fit you well. Are closed at the toe. Do not wear sandals. If you use a stepladder: Make sure that it is fully opened. Do not climb a closed stepladder. Make sure that both sides of the stepladder  are locked into place. Ask someone to hold it for you, if possible. Clearly mark and make sure that you can see: Any grab bars or handrails. First and last steps. Where the edge of each step is. Use tools that help you move around (mobility aids) if they are needed. These include: Canes. Walkers. Scooters. Crutches. Turn on the lights when you go into a dark area. Replace any light bulbs as soon as they burn out. Set up your furniture so you have a clear path. Avoid moving your furniture around. If any of your floors are uneven, fix them. If there are any pets around you, be aware of where they are. Review your medicines with your doctor. Some medicines can make you feel dizzy. This can increase your chance of falling. Ask your doctor what other things that you can do to help prevent falls. This information is not intended to replace advice given to you by your health care provider. Make sure you discuss any questions you have with your health care provider. Document Released: 06/01/2009 Document Revised: 01/11/2016 Document Reviewed: 09/09/2014 Elsevier Interactive Patient Education  2017 [employer].

## 2021-03-14 NOTE — Progress Notes (Addendum)
Virtual Visit via Telephone Note  I connected with  Peter Bond on 03/14/21 at 11:45 AM EDT by telephone and verified that I am speaking with the correct person using two identifiers.  Medicare Annual Wellness visit completed telephonically due to Covid-19 pandemic.   Persons participating in this call: This Health Coach and this patient.   Location: Patient: Home Provider: Office   I discussed the limitations, risks, security and privacy concerns of performing an evaluation and management service by telephone and the availability of in person appointments. The patient expressed understanding and agreed to proceed.  Unable to perform video visit due to video visit attempted and failed and/or patient does not have video capability.   Some vital signs may be absent or patient reported.   Willette Brace, LPN   Subjective:   Peter Bond is a 75 y.o. male who presents for Medicare Annual/Subsequent preventive examination.  Review of Systems     Cardiac Risk Factors include: advanced age (>42mn, >>78women);male gender;dyslipidemia     Objective:    There were no vitals filed for this visit. There is no height or weight on file to calculate BMI.  Advanced Directives 03/14/2021 03/08/2020 12/31/2019 09/18/2018 09/17/2017 08/09/2016 05/22/2015  Does Patient Have a Medical Advance Directive? Yes Yes Yes Yes Yes Yes Yes  Type of Advance Directive Living will [place] will Living will Living will - Living will;Healthcare Power of Attorney Living will  Does patient want to make changes to medical advance directive? - - No - Patient declined - - - -  Copy of Healthcare Power of Attorney in Chart? - No - copy requested - - - - -  Would patient like information on creating a medical advance directive? - - - Yes (MAU/Ambulatory/Procedural Areas - Information given) - - -    Current Medications (verified) Outpatient Encounter Medications as of 03/14/2021   Medication Sig   aspirin EC 81 MG tablet Take 1 tablet (81 mg total) by mouth daily.   atorvastatin (LIPITOR) 40 MG tablet Take 1 tablet (40 mg total) by mouth daily.   Multiple Vitamin (MULTIVITAMIN) tablet Take 1 tablet by mouth daily.   No facility-administered encounter medications on file as of 03/14/2021.    Allergies (verified) Patient has no known allergies.   History: Past Medical History:  Diagnosis Date   Cataract    1980's cataract removed   GERD (gastroesophageal reflux disease)    Glaucoma    left eye    Hyperlipidemia    Prostate cancer ([facility]    Past Surgical History:  Procedure Laterality Date   CATARACT EXTRACTION     1980's   COLONOSCOPY  06-19-15   per Dr. AHavery Moros adenomatous polyp and sigmoid diverticulosis, repeat in 5 yrs     COLONOSCOPY  12/20/2020   per Dr. AHavery Moros internal hemorrhoids and diverticulosis, no polyps. No repeats needed.    corneal implant     PITUITARY EXCISION     pituitary gland removal   PROSTATE BIOPSY     Family History  Problem Relation Age of Onset   Prostate cancer Brother    Prostate cancer Father    Stroke Other    Prostate cancer Brother    Prostate cancer Brother    Colon cancer Neg Hx    Esophageal cancer Neg Hx    Rectal cancer Neg Hx    Stomach cancer Neg Hx    Breast cancer Neg Hx    Colon polyps Neg Hx  Social History   Socioeconomic History   Marital status: Married    Spouse name: Not on file   Number of children: 1   Years of education: Not on file   Highest education level: Not on file  Occupational History    Comment: full time sales at [employer]  Tobacco Use   Smoking status: Never   Smokeless tobacco: Never  Vaping Use   Vaping Use: Never used  Substance and Sexual Activity   Alcohol use: Yes    Alcohol/week: 1.0 standard drink    Types: 1 Glasses of wine per week    Comment: rare   Drug use: No   Sexual activity: Yes  Other Topics Concern   Not on file  Social  History Narrative   09/18/2018: Lives with wife and adopted son in multi-level home.    Son is deaf, no significant stress as caregiver   Works out daily at home doing crunches, works full time still in [employer] manager. Does not want to retire.       Social Determinants of Health   Financial Resource Strain: Low Risk    Difficulty of Paying Living Expenses: Not hard at all  Food Insecurity: No Food Insecurity   Worried About Charity fundraiser in the Last Year: Never true   Joaquin in the Last Year: Never true  Transportation Needs: No Transportation Needs   Lack of Transportation (Medical): No   Lack of Transportation (Non-Medical): No  Physical Activity: Sufficiently Active   Days of Exercise per Week: 5 days   Minutes of Exercise per Session: 30 min  Stress: No Stress Concern Present   Feeling of Stress : Not at all  Social Connections: Moderately Integrated   Frequency of Communication with Friends and Family: More than three times a week   Frequency of Social Gatherings with Friends and Family: More than three times a week   Attends Religious Services: More than 4 times per year   Active Member of [employer] or Organizations: No   Attends Music therapist: Never   Marital Status: Married    Tobacco Counseling Counseling given: Not Answered   Clinical Intake:  Pre-visit preparation completed: Yes  Pain : No/denies pain     BMI - recorded: 26.68 Nutritional Status: BMI 25 -29 Overweight Nutritional Risks: None Diabetes: No  How often do you need to have someone help you when you read instructions, pamphlets, or other written materials from your doctor or pharmacy?: 1 - Never  Diabetic?No  Interpreter Needed?: No  Information entered by :: Charlott Rakes, LPN   Activities of Daily Living In your present state of health, do you have any difficulty performing the following activities: 03/14/2021  Hearing? N  Vision? N  Difficulty concentrating or  making decisions? N  Walking or climbing stairs? N  Dressing or bathing? N  Doing errands, shopping? N  Preparing Food and eating ? N  Using the Toilet? N  In the past six months, have you accidently leaked urine? N  Do you have problems with loss of bowel control? N  Managing your Medications? N  Managing your Finances? N  Housekeeping or managing your Housekeeping? N  Some recent data might be hidden    Patient Care Team: Laurey Morale, MD as PCP - Evalina Field, MD as Consulting Physician (Ophthalmology) Kristeen Miss, MD as Consulting Physician (Gastroenterology)  Indicate any recent Medical Services you may have received from other  than [facility] providers in the past year (date may be approximate).     Assessment:   This is a routine wellness examination for Peter Bond.  Hearing/Vision screen Hearing Screening - Comments:: Pt denies any hearing issues  Vision Screening - Comments:: Pt follows with Dr Katy Fitch for annual eye exams   Dietary issues and exercise activities discussed: Current Exercise Habits: Home exercise routine, Type of exercise: walking;strength training/weights;Other - see comments, Time (Minutes): 30, Frequency (Times/Week): 5, Weekly Exercise (Minutes/Week): 150   Goals Addressed             This Visit's Progress    Patient Stated       Stay healthy        Depression Screen PHQ 2/9 Scores 03/14/2021 01/01/2021 03/08/2020 09/23/2018 09/18/2018 09/17/2017 08/09/2016  PHQ - 2 Score 0 2 0 0 0 0 0  PHQ- 9 Score - 2 - - 0 - -    Fall Risk Fall Risk  03/14/2021 01/01/2021 01/01/2021 03/08/2020 09/23/2018  Falls in the past year? 0 0 0 0 0  Number falls in past yr: 0 0 0 0 -  Injury with Fall? 0 0 0 0 -  Risk for fall due to : - No Fall Risks No Fall Risks Impaired vision -  Follow up Falls prevention discussed Falls evaluation completed - Falls prevention discussed -    FALL RISK PREVENTION PERTAINING TO THE HOME:  Any stairs in or around the home? Yes   If so, are there any without handrails? No  Home free of loose throw rugs in walkways, pet beds, electrical cords, etc? Yes  Adequate lighting in your home to reduce risk of falls? Yes   ASSISTIVE DEVICES UTILIZED TO PREVENT FALLS:  Life alert? No  Use of a cane, walker or w/c? No  Grab bars in the bathroom? No  Shower chair or bench in shower? No  Elevated toilet seat or a handicapped toilet? No   TIMED UP AND GO:  Was the test performed? No .     Cognitive Function: MMSE - Mini Mental State Exam 08/09/2016  Not completed: (No Data)     6CIT Screen 03/14/2021 03/08/2020  What Year? 0 points 0 points  What month? 0 points 0 points  What time? 0 points 0 points  Count back from 20 0 points 0 points  Months in reverse 0 points 0 points  Repeat phrase 0 points 2 points  Total Score 0 2    Immunizations Immunization History  Administered Date(s) Administered   Fluad Quad(high Dose 65+) 09/27/2019   Influenza-Unspecified 07/19/2017, 06/19/2018   PFIZER(Purple Top)SARS-COV-2 Vaccination 02/02/2020, 02/25/2020   Pneumococcal Conjugate-13 09/23/2018   Pneumococcal Polysaccharide-23 09/27/2019   Td 08/20/1999, 10/23/2009   Tdap 09/17/2017    TDAP status: Up to date  Flu Vaccine status: Up to date  Pneumococcal vaccine status: Up to date  Covid-19 vaccine status: Completed vaccines  Qualifies for Shingles Vaccine? Yes   Zostavax completed Yes   Shingrix Completed?: No.    Education has been provided regarding the importance of this vaccine. Patient has been advised to call insurance company to determine out of pocket expense if they have not yet received this vaccine. Advised may also receive vaccine at local pharmacy or Health Dept. Verbalized acceptance and understanding.  Screening Tests Health Maintenance  Topic Date Due   Hepatitis C Screening  Never done   Zoster Vaccines- Shingrix (1 of 2) Never done   COVID-19 Vaccine (3 - Pfizer risk  series) 01/16/2022  (Originally 03/24/2020)   INFLUENZA VACCINE  03/19/2021   COLONOSCOPY (Pts 45-44yr Insurance coverage will need to be confirmed)  12/20/2025   TETANUS/TDAP  09/18/2027   PNA vac Low Risk Adult  Completed   HPV VACCINES  Aged Out    Health Maintenance  Health Maintenance Due  Topic Date Due   Hepatitis C Screening  Never done   Zoster Vaccines- Shingrix (1 of 2) Never done    Colorectal cancer screening: Type of screening: Colonoscopy. Completed 12/20/20. Repeat every 5 years   Additional Screening:  Hepatitis C Screening: does qualify;  Vision Screening: Recommended annual ophthalmology exams for early detection of glaucoma and other disorders of the eye. Is the patient up to date with their annual eye exam?  Yes  Who is the provider or what is the name of the office in which the patient attends annual eye exams? Dr GKaty FitchIf pt is not established with a provider, would they like to be referred to a provider to establish care? No .   Dental Screening: Recommended annual dental exams for proper oral hygiene  Community Resource Referral / Chronic Care Management: CRR required this visit?  No   CCM required this visit?  No      Plan:     I have personally reviewed and noted the following in the patient's chart:   Medical and social history Use of alcohol, tobacco or illicit drugs  Current medications and supplements including opioid prescriptions. Patient is not currently taking opioid prescriptions. Functional ability and status Nutritional status Physical activity Advanced directives List of other physicians Hospitalizations, surgeries, and ER visits in previous 12 months Vitals Screenings to include cognitive, depression, and falls Referrals and appointments  In addition, I have reviewed and discussed with patient certain preventive protocols, quality metrics, and best practice recommendations. A written personalized care plan for preventive services as well as  general preventive health recommendations were provided to patient.     TWillette Brace LPN   [id number]  Nurse Notes: None

## 2021-04-20 ENCOUNTER — Other Ambulatory Visit: Payer: Self-pay

## 2021-05-29 ENCOUNTER — Other Ambulatory Visit: Payer: Self-pay | Admitting: Internal Medicine

## 2021-06-11 ENCOUNTER — Ambulatory Visit (INDEPENDENT_AMBULATORY_CARE_PROVIDER_SITE_OTHER): Payer: Medicare Other | Admitting: Family Medicine

## 2021-06-11 ENCOUNTER — Other Ambulatory Visit: Payer: Self-pay

## 2021-06-11 ENCOUNTER — Encounter: Payer: Self-pay | Admitting: Family Medicine

## 2021-06-11 VITALS — BP 128/78 | HR 67 | Temp 97.7°F | Wt 166.0 lb

## 2021-06-11 DIAGNOSIS — M542 Cervicalgia: Secondary | ICD-10-CM | POA: Diagnosis not present

## 2021-06-11 DIAGNOSIS — H6123 Impacted cerumen, bilateral: Secondary | ICD-10-CM | POA: Diagnosis not present

## 2021-06-11 MED ORDER — CYCLOBENZAPRINE HCL 10 MG PO TABS: 10.0000 mg | ORAL_TABLET | Freq: Three times a day (TID) | ORAL | 0 refills | Status: AC | PRN

## 2021-06-11 NOTE — Progress Notes (Signed)
   Subjective:    Patient ID: Peter Bond, male    DOB: 1945/11/29, 75 y.o.   MRN: [id number]  HPI Her for 2 issues. First about one week ago he developed some tightness and pain in the back of the neck on both sides. No pain in the arms. No recent trauma. Using heat and Tylenol with partial relief. Then 3 days ago he had some mild pain in the right ear and muffled hearing. He used an OTC wax removal kit and now it feels better. No fever or sinus congestion or ST.   Review of Systems  Constitutional: Negative.   HENT:  Positive for ear discharge, ear pain and hearing loss. Negative for congestion, postnasal drip, sinus pressure and sore throat.   Eyes: Negative.   Respiratory: Negative.    Musculoskeletal:  Positive for neck pain and neck stiffness.      Objective:   Physical Exam Constitutional:      Appearance: Normal appearance. He is not ill-appearing.  HENT:     Right Ear: There is impacted cerumen.     Left Ear: There is impacted cerumen.     Nose: Nose normal.     Mouth/Throat:     Pharynx: Oropharynx is clear.  Eyes:     Conjunctiva/sclera: Conjunctivae normal.  Pulmonary:     Effort: Pulmonary effort is normal.     Breath sounds: Normal breath sounds.  Musculoskeletal:     Comments: He is tender on both sides of the posterior neck. Not tender over the spine. ROM is full   Lymphadenopathy:     Cervical: No cervical adenopathy.  Neurological:     Mental Status: He is alert.          Assessment & Plan:  He has some neck spasms so he will use Flexeril and heat as needed. The cerumen impactions were both irrigated clear with water after informed consent was obtained. He tolerated the procedure well,  and afterwards he could hear better. Follow up as needed  Alysia Penna, MD

## 2021-06-14 DIAGNOSIS — H11003 Unspecified pterygium of eye, bilateral: Secondary | ICD-10-CM | POA: Diagnosis not present

## 2021-06-14 DIAGNOSIS — Z961 Presence of intraocular lens: Secondary | ICD-10-CM | POA: Diagnosis not present

## 2021-06-14 DIAGNOSIS — H40051 Ocular hypertension, right eye: Secondary | ICD-10-CM | POA: Diagnosis not present

## 2021-06-14 DIAGNOSIS — H401122 Primary open-angle glaucoma, left eye, moderate stage: Secondary | ICD-10-CM | POA: Diagnosis not present

## 2021-06-14 DIAGNOSIS — Z9889 Other specified postprocedural states: Secondary | ICD-10-CM | POA: Diagnosis not present

## 2021-06-14 DIAGNOSIS — H2511 Age-related nuclear cataract, right eye: Secondary | ICD-10-CM | POA: Diagnosis not present

## 2021-06-26 DIAGNOSIS — H401122 Primary open-angle glaucoma, left eye, moderate stage: Secondary | ICD-10-CM | POA: Diagnosis not present

## 2021-06-26 DIAGNOSIS — H40051 Ocular hypertension, right eye: Secondary | ICD-10-CM | POA: Diagnosis not present

## 2021-06-26 DIAGNOSIS — H2511 Age-related nuclear cataract, right eye: Secondary | ICD-10-CM | POA: Diagnosis not present

## 2021-06-26 DIAGNOSIS — H11003 Unspecified pterygium of eye, bilateral: Secondary | ICD-10-CM | POA: Diagnosis not present

## 2021-06-26 DIAGNOSIS — Z9889 Other specified postprocedural states: Secondary | ICD-10-CM | POA: Diagnosis not present

## 2021-06-26 DIAGNOSIS — Z961 Presence of intraocular lens: Secondary | ICD-10-CM | POA: Diagnosis not present

## 2021-07-02 ENCOUNTER — Telehealth: Payer: Self-pay | Admitting: Family Medicine

## 2021-07-02 MED ORDER — ATORVASTATIN CALCIUM 40 MG PO TABS: 40.0000 mg | ORAL_TABLET | Freq: Every day | ORAL | 1 refills | Status: DC

## 2021-07-02 NOTE — Telephone Encounter (Signed)
Spoke with patient he is aware refill for Atorvastatin 40mg  has been sent to [facility].

## 2021-07-02 NOTE — Telephone Encounter (Signed)
Pt is calling and would like get atorvastatin 40 mg #[facility] of [facility]  Pt would order for atorvastatin to be called at [facility] on [address] [employer] ([place], [place] Phone:  [phone]  Fax:  [phone]

## 2021-10-24 DIAGNOSIS — H11003 Unspecified pterygium of eye, bilateral: Secondary | ICD-10-CM | POA: Diagnosis not present

## 2021-10-24 DIAGNOSIS — Z961 Presence of intraocular lens: Secondary | ICD-10-CM | POA: Diagnosis not present

## 2021-10-24 DIAGNOSIS — H401122 Primary open-angle glaucoma, left eye, moderate stage: Secondary | ICD-10-CM | POA: Diagnosis not present

## 2021-10-24 DIAGNOSIS — H40051 Ocular hypertension, right eye: Secondary | ICD-10-CM | POA: Diagnosis not present

## 2021-10-24 DIAGNOSIS — H2511 Age-related nuclear cataract, right eye: Secondary | ICD-10-CM | POA: Diagnosis not present

## 2021-10-24 DIAGNOSIS — D492 Neoplasm of unspecified behavior of bone, soft tissue, and skin: Secondary | ICD-10-CM | POA: Diagnosis not present

## 2021-10-31 DIAGNOSIS — B078 Other viral warts: Secondary | ICD-10-CM | POA: Diagnosis not present

## 2021-10-31 DIAGNOSIS — D492 Neoplasm of unspecified behavior of bone, soft tissue, and skin: Secondary | ICD-10-CM | POA: Diagnosis not present

## 2021-11-13 ENCOUNTER — Other Ambulatory Visit: Payer: Self-pay | Admitting: Family Medicine

## 2021-11-13 DIAGNOSIS — H2511 Age-related nuclear cataract, right eye: Secondary | ICD-10-CM | POA: Diagnosis not present

## 2021-11-13 DIAGNOSIS — Z961 Presence of intraocular lens: Secondary | ICD-10-CM | POA: Diagnosis not present

## 2021-11-13 DIAGNOSIS — D492 Neoplasm of unspecified behavior of bone, soft tissue, and skin: Secondary | ICD-10-CM | POA: Diagnosis not present

## 2021-11-13 DIAGNOSIS — H11003 Unspecified pterygium of eye, bilateral: Secondary | ICD-10-CM | POA: Diagnosis not present

## 2021-11-13 DIAGNOSIS — H401122 Primary open-angle glaucoma, left eye, moderate stage: Secondary | ICD-10-CM | POA: Diagnosis not present

## 2021-11-13 DIAGNOSIS — H40051 Ocular hypertension, right eye: Secondary | ICD-10-CM | POA: Diagnosis not present

## 2021-11-14 DIAGNOSIS — H401122 Primary open-angle glaucoma, left eye, moderate stage: Secondary | ICD-10-CM | POA: Diagnosis not present

## 2021-11-14 DIAGNOSIS — H40051 Ocular hypertension, right eye: Secondary | ICD-10-CM | POA: Diagnosis not present

## 2021-11-14 DIAGNOSIS — H5789 Other specified disorders of eye and adnexa: Secondary | ICD-10-CM | POA: Diagnosis not present

## 2021-11-20 DIAGNOSIS — H40051 Ocular hypertension, right eye: Secondary | ICD-10-CM | POA: Diagnosis not present

## 2021-11-20 DIAGNOSIS — H5789 Other specified disorders of eye and adnexa: Secondary | ICD-10-CM | POA: Diagnosis not present

## 2021-11-20 DIAGNOSIS — H401122 Primary open-angle glaucoma, left eye, moderate stage: Secondary | ICD-10-CM | POA: Diagnosis not present

## 2021-11-26 DIAGNOSIS — H5789 Other specified disorders of eye and adnexa: Secondary | ICD-10-CM | POA: Diagnosis not present

## 2021-11-26 DIAGNOSIS — H401122 Primary open-angle glaucoma, left eye, moderate stage: Secondary | ICD-10-CM | POA: Diagnosis not present

## 2021-11-26 DIAGNOSIS — H40051 Ocular hypertension, right eye: Secondary | ICD-10-CM | POA: Diagnosis not present

## 2021-12-10 DIAGNOSIS — H5789 Other specified disorders of eye and adnexa: Secondary | ICD-10-CM | POA: Diagnosis not present

## 2021-12-10 DIAGNOSIS — H401122 Primary open-angle glaucoma, left eye, moderate stage: Secondary | ICD-10-CM | POA: Diagnosis not present

## 2021-12-10 DIAGNOSIS — H40051 Ocular hypertension, right eye: Secondary | ICD-10-CM | POA: Diagnosis not present

## 2021-12-10 DIAGNOSIS — H11003 Unspecified pterygium of eye, bilateral: Secondary | ICD-10-CM | POA: Diagnosis not present

## 2021-12-10 DIAGNOSIS — H2511 Age-related nuclear cataract, right eye: Secondary | ICD-10-CM | POA: Diagnosis not present

## 2021-12-10 DIAGNOSIS — Z961 Presence of intraocular lens: Secondary | ICD-10-CM | POA: Diagnosis not present

## 2021-12-25 DIAGNOSIS — H5789 Other specified disorders of eye and adnexa: Secondary | ICD-10-CM | POA: Diagnosis not present

## 2021-12-25 DIAGNOSIS — H2511 Age-related nuclear cataract, right eye: Secondary | ICD-10-CM | POA: Diagnosis not present

## 2021-12-25 DIAGNOSIS — H401122 Primary open-angle glaucoma, left eye, moderate stage: Secondary | ICD-10-CM | POA: Diagnosis not present

## 2021-12-25 DIAGNOSIS — Z961 Presence of intraocular lens: Secondary | ICD-10-CM | POA: Diagnosis not present

## 2021-12-25 DIAGNOSIS — H40051 Ocular hypertension, right eye: Secondary | ICD-10-CM | POA: Diagnosis not present

## 2021-12-25 DIAGNOSIS — H11003 Unspecified pterygium of eye, bilateral: Secondary | ICD-10-CM | POA: Diagnosis not present

## 2022-01-02 ENCOUNTER — Encounter: Payer: Self-pay | Admitting: Family Medicine

## 2022-01-02 ENCOUNTER — Ambulatory Visit (INDEPENDENT_AMBULATORY_CARE_PROVIDER_SITE_OTHER): Payer: Medicare Other | Admitting: Family Medicine

## 2022-01-02 VITALS — BP 128/82 | HR 61 | Temp 98.0°F | Ht 66.0 in | Wt 161.2 lb

## 2022-01-02 DIAGNOSIS — C61 Malignant neoplasm of prostate: Secondary | ICD-10-CM | POA: Diagnosis not present

## 2022-01-02 DIAGNOSIS — N528 Other male erectile dysfunction: Secondary | ICD-10-CM

## 2022-01-02 DIAGNOSIS — E785 Hyperlipidemia, unspecified: Secondary | ICD-10-CM

## 2022-01-02 DIAGNOSIS — K219 Gastro-esophageal reflux disease without esophagitis: Secondary | ICD-10-CM

## 2022-01-02 DIAGNOSIS — R030 Elevated blood-pressure reading, without diagnosis of hypertension: Secondary | ICD-10-CM

## 2022-01-02 DIAGNOSIS — R739 Hyperglycemia, unspecified: Secondary | ICD-10-CM | POA: Diagnosis not present

## 2022-01-02 LAB — HEMOGLOBIN A1C: Hgb A1c MFr Bld: 5.8 % (ref 4.6–6.5)

## 2022-01-02 LAB — CBC WITH DIFFERENTIAL/PLATELET
Basophils Absolute: 0 10*3/uL (ref 0.0–0.1)
Basophils Relative: 0.7 % (ref 0.0–3.0)
Eosinophils Absolute: 0.1 10*3/uL (ref 0.0–0.7)
Eosinophils Relative: 3.4 % (ref 0.0–5.0)
HCT: 43.4 % (ref 39.0–52.0)
Hemoglobin: 14.4 g/dL (ref 13.0–17.0)
Lymphocytes Relative: 42.4 % (ref 12.0–46.0)
Lymphs Abs: 1.4 10*3/uL (ref 0.7–4.0)
MCHC: 33.2 g/dL (ref 30.0–36.0)
MCV: 86.8 fl (ref 78.0–100.0)
Monocytes Absolute: 0.3 10*3/uL (ref 0.1–1.0)
Monocytes Relative: 10.7 % (ref 3.0–12.0)
Neutro Abs: 1.4 10*3/uL (ref 1.4–7.7)
Neutrophils Relative %: 42.8 % — ABNORMAL LOW (ref 43.0–77.0)
Platelets: 190 10*3/uL (ref 150.0–400.0)
RBC: 5 Mil/uL (ref 4.22–5.81)
RDW: 13.4 % (ref 11.5–15.5)
WBC: 3.3 10*3/uL — ABNORMAL LOW (ref 4.0–10.5)

## 2022-01-02 LAB — BASIC METABOLIC PANEL
BUN: 13 mg/dL (ref 6–23)
CO2: 30 mEq/L (ref 19–32)
Calcium: 9.2 mg/dL (ref 8.4–10.5)
Chloride: 104 mEq/L (ref 96–112)
Creatinine, Ser: 1.42 mg/dL (ref 0.40–1.50)
GFR: 48.08 mL/min — ABNORMAL LOW (ref >60.00)
Glucose, Bld: 83 mg/dL (ref 70–99)
Potassium: 4.1 mEq/L (ref 3.5–5.1)
Sodium: 142 mEq/L (ref 135–145)

## 2022-01-02 LAB — HEPATIC FUNCTION PANEL
ALT: 24 U/L (ref 0–53)
AST: 22 U/L (ref 0–37)
Albumin: 4.3 g/dL (ref 3.5–5.2)
Alkaline Phosphatase: 80 U/L (ref 39–117)
Bilirubin, Direct: 0.1 mg/dL (ref 0.0–0.3)
Total Bilirubin: 0.7 mg/dL (ref 0.2–1.2)
Total Protein: 6.9 g/dL (ref 6.0–8.3)

## 2022-01-02 LAB — LIPID PANEL
Cholesterol: 161 mg/dL (ref 0–200)
HDL: 57.4 mg/dL (ref >39.00)
LDL Cholesterol: 89 mg/dL (ref 0–99)
NonHDL: 103.38
Total CHOL/HDL Ratio: 3
Triglycerides: 71 mg/dL (ref 0.0–149.0)
VLDL: 14.2 mg/dL (ref 0.0–40.0)

## 2022-01-02 LAB — TSH: TSH: 0.83 u[IU]/mL (ref 0.35–5.50)

## 2022-01-02 NOTE — Progress Notes (Signed)
? ?Subjective:  ? ? Patient ID: Peter Bond, male    DOB: 09-09-1945, 76 y.o.   MRN: [id number] ? ?HPI ?Here to follow up on issues. He is doing well. He sees Urology twice a year and the cancer has been stable. His BP is stable. His GERD is controlled.  ? ? ?Review of Systems  ?Constitutional: Negative.   ?HENT: Negative.    ?Eyes: Negative.   ?Respiratory: Negative.    ?Cardiovascular: Negative.   ?Gastrointestinal: Negative.   ?Genitourinary: Negative.   ?Musculoskeletal: Negative.   ?Skin: Negative.   ?Neurological: Negative.   ?Psychiatric/Behavioral: Negative.    ? ?   ?Objective:  ? Physical Exam ?Constitutional:   ?   General: He is not in acute distress. ?   Appearance: Normal appearance. He is well-developed. He is not diaphoretic.  ?HENT:  ?   Head: Normocephalic and atraumatic.  ?   Right Ear: External ear normal.  ?   Left Ear: External ear normal.  ?   Nose: Nose normal.  ?   Mouth/Throat:  ?   Pharynx: No oropharyngeal exudate.  ?Eyes:  ?   General: No scleral icterus.    ?   Right eye: No discharge.     ?   Left eye: No discharge.  ?   Conjunctiva/sclera: Conjunctivae normal.  ?   Pupils: Pupils are equal, round, and reactive to light.  ?Neck:  ?   Thyroid: No thyromegaly.  ?   Vascular: No JVD.  ?   Trachea: No tracheal deviation.  ?Cardiovascular:  ?   Rate and Rhythm: Normal rate and regular rhythm.  ?   Heart sounds: Normal heart sounds. No murmur heard. ?  No friction rub. No gallop.  ?Pulmonary:  ?   Effort: Pulmonary effort is normal. No respiratory distress.  ?   Breath sounds: Normal breath sounds. No wheezing or rales.  ?Chest:  ?   Chest wall: No tenderness.  ?Abdominal:  ?   General: Bowel sounds are normal. There is no distension.  ?   Palpations: Abdomen is soft. There is no mass.  ?   Tenderness: There is no abdominal tenderness. There is no guarding or rebound.  ?Genitourinary: ?   Penis: No tenderness.   ?Musculoskeletal:     ?   General: No tenderness. Normal range of motion.  ?    Cervical back: Neck supple.  ?Lymphadenopathy:  ?   Cervical: No cervical adenopathy.  ?Skin: ?   General: Skin is warm and dry.  ?   Coloration: Skin is not pale.  ?   Findings: No erythema or rash.  ?Neurological:  ?   Mental Status: He is alert and oriented to person, place, and time.  ?   Cranial Nerves: No cranial nerve deficit.  ?   Motor: No abnormal muscle tone.  ?   Coordination: Coordination normal.  ?   Deep Tendon Reflexes: Reflexes are normal and symmetric. Reflexes normal.  ?Psychiatric:     ?   Behavior: Behavior normal.     ?   Thought Content: Thought content normal.     ?   Judgment: Judgment normal.  ? ? ? ? ? ?   ?Assessment & Plan:  ?His BP is stable. His GERD is controlled. He will follow up with Urology for the prostate cancer. We will get fasting labs to check lipids, etc. We spent a total of ( 31  ) minutes reviewing records and discussing these issues.  ?  Alysia Penna, MD ? ? ?

## 2022-03-06 ENCOUNTER — Ambulatory Visit (INDEPENDENT_AMBULATORY_CARE_PROVIDER_SITE_OTHER): Payer: Medicare Other | Admitting: Family Medicine

## 2022-03-06 ENCOUNTER — Telehealth: Payer: Self-pay | Admitting: Family Medicine

## 2022-03-06 ENCOUNTER — Encounter: Payer: Self-pay | Admitting: Family Medicine

## 2022-03-06 VITALS — BP 120/80 | HR 65 | Temp 98.4°F | Wt 162.4 lb

## 2022-03-06 DIAGNOSIS — L237 Allergic contact dermatitis due to plants, except food: Secondary | ICD-10-CM | POA: Diagnosis not present

## 2022-03-06 MED ORDER — TRIAMCINOLONE ACETONIDE 0.1 % EX CREA: 1.0000 | TOPICAL_CREAM | Freq: Two times a day (BID) | CUTANEOUS | 2 refills | Status: DC

## 2022-03-06 NOTE — Telephone Encounter (Addendum)
Pt called to request a refill of the:  triamcinolone cream (KENALOG) 0.1 % prescribed in August of 2021 (Discontinued)  Pt was reporting he currently has some itchy bumps on his arm.  Pt was advised that he should come in so that MD could look at bumps, to ensure medication was appropriate.  Pt was very reluctant to come in, but agreed to come in this afternoon.  Is that okay?  Please advise.

## 2022-03-06 NOTE — Telephone Encounter (Signed)
Currently not on med list.   Okay to send?

## 2022-03-06 NOTE — Progress Notes (Signed)
   Subjective:    Patient ID: Peter Bond, male    DOB: 07-05-1946, 76 y.o.   MRN: [id number]  HPI Here for 3 days of itchy bumps on both arms. He has been doing work in his yard the past few weeks.    Review of Systems  Constitutional: Negative.   Respiratory: Negative.    Cardiovascular: Negative.   Skin:  Positive for rash.       Objective:   Physical Exam Constitutional:      Appearance: Normal appearance.  Cardiovascular:     Rate and Rhythm: Normal rate and regular rhythm.     Pulses: Normal pulses.     Heart sounds: Normal heart sounds.  Pulmonary:     Effort: Pulmonary effort is normal.     Breath sounds: Normal breath sounds.  Skin:    Comments: There are small clusters of vesicles in both antecubital areas   Neurological:     Mental Status: He is alert.           Assessment & Plan:  Contact dermatitis, apply Triamcinolone cream BID as needed.  Alysia Penna, MD

## 2022-03-06 NOTE — Telephone Encounter (Signed)
He was seen OV today

## 2022-03-15 ENCOUNTER — Ambulatory Visit (INDEPENDENT_AMBULATORY_CARE_PROVIDER_SITE_OTHER): Payer: Medicare Other

## 2022-03-15 VITALS — Ht 66.0 in | Wt 162.0 lb

## 2022-03-15 DIAGNOSIS — Z0000 Encounter for general adult medical examination without abnormal findings: Secondary | ICD-10-CM

## 2022-03-15 NOTE — Progress Notes (Signed)
Subjective:   Peter Bond is a 76 y.o. male who presents for Medicare Annual/Subsequent preventive examination.  Review of Systems    Virtual Visit via Telephone Note  I connected with  Peter Bond on 03/15/22 at  8:15 AM EDT by telephone and verified that I am speaking with the correct person using two identifiers.  Location: Patient: Home Provider: Office Persons participating in the virtual visit: patient/Nurse Health Advisor   I discussed the limitations, risks, security and privacy concerns of performing an evaluation and management service by telephone and the availability of in person appointments. The patient expressed understanding and agreed to proceed.  Interactive audio and video telecommunications were attempted between this nurse and patient, however failed, due to patient having technical difficulties OR patient did not have access to video capability.  We continued and completed visit with audio only.  Some vital signs may be absent or patient reported.   Peter Peaches, LPN  Cardiac Risk Factors include: advanced age (>5mn, >>71women)     Objective:    Today's Vitals   03/15/22 0821  Weight: 162 lb (73.5 kg)  Height: '5\' 6"'$  (1.676 m)   Body mass index is 26.15 kg/m.     03/15/2022    8:28 AM 03/14/2021   11:31 AM 03/08/2020   11:36 AM 12/31/2019    2:42 PM 09/18/2018   11:55 AM 09/17/2017   10:13 AM 08/09/2016   11:29 AM  Advanced Directives  Does Patient Have a Medical Advance Directive? Yes Yes Yes Yes Yes Yes Yes  Type of AP[place]amed[place]of [place] will Living will [place]icture [place] will Living will Living will  Living will;Healthcare Power of Attorney  Does patient want to make changes to medical advance directive? No - Patient declined   No - Patient declined     Copy of [place] Chart? No - copy requested  No - copy requested      Would patient like information on creating  a medical advance directive?     Yes (MAU/Ambulatory/Procedural Areas - Information given)      Current Medications (verified) Outpatient Encounter Medications as of 03/15/2022  Medication Sig   aspirin EC 81 MG tablet Take 1 tablet (81 mg total) by mouth daily.   atorvastatin (LIPITOR) 40 MG tablet TAKE 1 TABLET BY MOUTH DAILY   cyclobenzaprine (FLEXERIL) 10 MG tablet Take 1 tablet (10 mg total) by mouth 3 (three) times daily as needed for muscle spasms.   erythromycin ophthalmic ointment SMARTSIG:sparingly Left Eye Twice Daily   fluorometholone (FML) 0.1 % ophthalmic suspension Place 1 drop into the left eye 2 (two) times daily.   Multiple Vitamin (MULTIVITAMIN) tablet Take 1 tablet by mouth daily.   triamcinolone cream (KENALOG) 0.1 % Apply 1 Application topically 2 (two) times daily.   No facility-administered encounter medications on file as of 03/15/2022.    Allergies (verified) Patient has no known allergies.   History: Past Medical History:  Diagnosis Date   Cataract    1980's cataract removed   GERD (gastroesophageal reflux disease)    Glaucoma    left eye    Hyperlipidemia    Prostate cancer ([place]    Past Surgical History:  Procedure Laterality Date   CATARACT EXTRACTION     1980's   COLONOSCOPY  06-19-15   per Dr. AHavery Moros adenomatous polyp and sigmoid diverticulosis, repeat in 5 yrs     COLONOSCOPY  12/20/2020   per  Dr. Havery Moros, internal hemorrhoids and diverticulosis, no polyps. No repeats needed.    corneal implant     PITUITARY EXCISION     pituitary gland removal   PROSTATE BIOPSY     Family History  Problem Relation Age of Onset   Prostate cancer Brother    Prostate cancer Father    Stroke Other    Prostate cancer Brother    Prostate cancer Brother    Colon cancer Neg Hx    Esophageal cancer Neg Hx    Rectal cancer Neg Hx    Stomach cancer Neg Hx    Breast cancer Neg Hx    Colon polyps Neg Hx    Social History   Socioeconomic History    Marital status: Married    Spouse name: Not on file   Number of children: 1   Years of education: Not on file   Highest education level: Not on file  Occupational History    Comment: full time sales at [employer]  Tobacco Use   Smoking status: Never   Smokeless tobacco: Never  Vaping Use   Vaping Use: Never used  Substance and Sexual Activity   Alcohol use: Yes    Alcohol/week: 1.0 standard drink of alcohol    Types: 1 Glasses of wine per week    Comment: rare   Drug use: No   Sexual activity: Yes  Other Topics Concern   Not on file  Social History Narrative   09/18/2018: Lives with wife and adopted son in multi-level home.    Son is deaf, no significant stress as caregiver   Works out daily at home doing crunches, works full time still in [employer]. Does not want to retire.       Social Determinants of Health   Financial Resource Strain: Low Risk  (03/15/2022)   Overall Financial Resource Strain (CARDIA)    Difficulty of Paying Living Expenses: Not hard at all  Food Insecurity: No Food Insecurity (03/15/2022)   Hunger Vital Sign    Worried About Running Out of Food in the Last Year: Never true    Ran Out of Food in the Last Year: Never true  Transportation Needs: No Transportation Needs (03/15/2022)   PRAPARE - Hydrologist (Medical): No    Lack of Transportation (Non-Medical): No  Physical Activity: Sufficiently Active (03/15/2022)   Exercise Vital Sign    Days of Exercise per Week: 7 days    Minutes of Exercise per Session: 30 min  Stress: No Stress Concern Present (03/15/2022)   [place]    Feeling of Stress : Not at all  Social Connections: [place] (03/15/2022)   Social Connection and Isolation Panel [NHANES]    Frequency of Communication with Friends and Family: More than three times a week    Frequency of Social Gatherings with Friends and Family: More  than three times a week    Attends Religious Services: More than 4 times per year    Active Member of [employer] or Organizations: Yes    Attends Music therapist: More than 4 times per year    Marital Status: Married    Tobacco Counseling Counseling given: Not Answered   Clinical Intake:   Diabetic?  No  Interpreter Needed?: NoActivities of Daily Living    03/15/2022    8:26 AM  In your present state of health, do you have any difficulty performing the  following activities:  Hearing? 0  Vision? 0  Difficulty concentrating or making decisions? 0  Walking or climbing stairs? 0  Dressing or bathing? 0  Doing errands, shopping? 0  Preparing Food and eating ? N  Using the Toilet? N  In the past six months, have you accidently leaked urine? N  Do you have problems with loss of bowel control? N  Managing your Medications? N  Managing your Finances? N  Housekeeping or managing your Housekeeping? N    Patient Care Team: Laurey Morale, MD as PCP - Evalina Field, MD as Consulting Physician (Ophthalmology) Kristeen Miss, MD as Consulting Physician (Gastroenterology)  Indicate any recent Medical Services you may have received from other than [facility] providers in the past year (date may be approximate).     Assessment:   This is a routine wellness examination for Peter Bond.  Hearing/Vision screen Hearing Screening - Comments:: No hearing difficulty Vision Screening - Comments:: Wears glasses. Followed by Dr Katy Fitch  Dietary issues and exercise activities discussed: Exercise limited by: None identified   Goals Addressed               This Visit's Progress     Patient stated (pt-stated)        Try to get as many things done as I can.       Depression Screen    03/15/2022    8:24 AM 03/06/2022    3:52 PM 01/02/2022    9:00 AM 03/14/2021   11:30 AM 01/01/2021   10:57 AM 03/08/2020   11:34 AM 09/23/2018    9:27 AM  PHQ 2/9 Scores  PHQ - 2 Score 0 0 0 0 2 0  0  PHQ- 9 Score 0 0 0  2      Fall Risk    03/15/2022    8:26 AM 03/06/2022    3:52 PM 01/02/2022    9:00 AM 03/14/2021   11:32 AM 01/01/2021   10:57 AM  Fall Risk   Falls in the past year? 0 0 0 0 0  Number falls in past yr: 0 0 0 0 0  Injury with Fall? 0 0 0 0 0  Risk for fall due to : No Fall Risks No Fall Risks No Fall Risks  No Fall Risks  Follow up  Falls evaluation completed Falls evaluation completed Falls prevention discussed Falls evaluation completed    FALL RISK PREVENTION PERTAINING TO THE HOME:  Any stairs in or around the home? Yes  If so, are there any without handrails? No  Home free of loose throw rugs in walkways, pet beds, electrical cords, etc? Yes  Adequate lighting in your home to reduce risk of falls? Yes   ASSISTIVE DEVICES UTILIZED TO PREVENT FALLS:  Life alert? No  Use of a cane, walker or w/c? No  Grab bars in the bathroom? No  Shower chair or bench in shower? No  Elevated toilet seat or a handicapped toilet? No   TIMED UP AND GO:  Was the test performed? No . Audio Visit  Cognitive Function:        03/15/2022    8:28 AM 03/14/2021   11:36 AM 03/08/2020   11:41 AM  6CIT Screen  What Year? 0 points 0 points 0 points  What month? 0 points 0 points 0 points  What time? 0 points 0 points 0 points  Count back from 20 0 points 0 points 0 points  Months in reverse 0 points 0  points 0 points  Repeat phrase 0 points 0 points 2 points  Total Score 0 points 0 points 2 points    Immunizations Immunization History  Administered Date(s) Administered   Fluad Quad(high Dose 65+) 09/27/2019   Influenza-Unspecified 07/19/2017, 06/19/2018   PFIZER(Purple Top)SARS-COV-2 Vaccination 02/02/2020, 02/25/2020   Pneumococcal Conjugate-13 09/23/2018   Pneumococcal Polysaccharide-23 09/27/2019   Td 08/20/1999, 10/23/2009   Tdap 09/17/2017    TDAP status: Up to date    Pneumococcal vaccine status: Up to date  Covid-19 vaccine status: Completed  vaccines  Qualifies for Shingles Vaccine? Yes   Zostavax completed No   Shingrix Completed?: No.    Education has been provided regarding the importance of this vaccine. Patient has been advised to call insurance company to determine out of pocket expense if they have not yet received this vaccine. Advised may also receive vaccine at local pharmacy or Health Dept. Verbalized acceptance and understanding.  Screening Tests Health Maintenance  Topic Date Due   COVID-19 Vaccine (3 - Pfizer risk series) 03/31/2022 (Originally 03/24/2020)   Zoster Vaccines- Shingrix (1 of 2) 01/17/2023 (Originally 09[birth year]30/1964)   Hepatitis C Screening  03/16/2023 (Originally 10/01/1963)   INFLUENZA VACCINE  03/19/2022   COLONOSCOPY (Pts 45-12yr Insurance coverage will need to be confirmed)  12/20/2025   TETANUS/TDAP  09/18/2027   Pneumonia Vaccine 76 Years old  Completed   HPV VACCINES  Aged Out    Health Maintenance  There are no preventive care reminders to display for this patient.   Colorectal cancer screening: No longer required.   Lung Cancer Screening: (Low Dose CT Chest recommended if Age 76-80years, 30 pack-year currently smoking OR have quit w/in 15years.) does not qualify.     Additional Screening:  Hepatitis C Screening: does qualify; Completed Patient deferred  Vision Screening: Recommended annual ophthalmology exams for early detection of glaucoma and other disorders of the eye. Is the patient up to date with their annual eye exam?  Yes  Who is the provider or what is the name of the office in which the patient attends annual eye exams? Dr GKaty FitchIf pt is not established with a provider, would they like to be referred to a provider to establish care? No .   Dental Screening: Recommended annual dental exams for proper oral hygiene  Community Resource Referral / Chronic Care Management:  CRR required this visit?  No   CCM required this visit?  No      Plan:     I have personally  reviewed and noted the following in the patient's chart:   Medical and social history Use of alcohol, tobacco or illicit drugs  Current medications and supplements including opioid prescriptions. Patient is not currently taking opioid prescriptions. Functional ability and status Nutritional status Physical activity Advanced directives List of other physicians Hospitalizations, surgeries, and ER visits in previous 12 months Vitals Screenings to include cognitive, depression, and falls Referrals and appointments  In addition, I have reviewed and discussed with patient certain preventive protocols, quality metrics, and best practice recommendations. A written personalized care plan for preventive services as well as general preventive health recommendations were provided to patient.     BCriselda Peaches LPN   [id number]  Nurse Notes: None

## 2022-03-15 NOTE — Patient Instructions (Addendum)
Peter Bond , Thank you for taking time to come for your Medicare Wellness Visit. I appreciate your ongoing commitment to your health goals. Please review the following plan we discussed and let me know if I can assist you in the future.   These are the goals we discussed:  Goals       patient      Keep my exercise up;       Patient Stated      Will try to slow down Go to 5 days a week!      Patient Stated      Keep exercise routine, and keep working!       Patient Stated      To stay healthy with exercise      Patient Stated      Stay healthy       Patient stated (pt-stated)      Try to get as many things done as I can.        This is a list of the screening recommended for you and due dates:  Health Maintenance  Topic Date Due   COVID-19 Vaccine (3 - Pfizer risk series) 03/31/2022*   Zoster (Shingles) Vaccine (1 of 2) 01/17/2023*   Hepatitis C Screening: USPSTF Recommendation to screen - Ages 18-79 yo.  03/16/2023*   Flu Shot  03/19/2022   Colon Cancer Screening  12/20/2025   Tetanus Vaccine  09/18/2027   Pneumonia Vaccine  Completed   HPV Vaccine  Aged Out  *Topic was postponed. The date shown is not the original due date.    Advanced directives: Yes  Conditions/risks identified: None  Next appointment: Follow up in one year for your annual wellness visit.   Preventive Care 33 Years and Older, Male Preventive care refers to lifestyle choices and visits with your health care provider that can promote health and wellness. What does preventive care include? A yearly physical exam. This is also called an annual well check. Dental exams once or twice a year. Routine eye exams. Ask your health care provider how often you should have your eyes checked. Personal lifestyle choices, including: Daily care of your teeth and gums. Regular physical activity. Eating a healthy diet. Avoiding tobacco and drug use. Limiting alcohol use. Practicing safe sex. Taking low  doses of aspirin every day. Taking vitamin and mineral supplements as recommended by your health care provider. What happens during an annual well check? The services and screenings done by your health care provider during your annual well check will depend on your age, overall health, lifestyle risk factors, and family history of disease. Counseling  Your health care provider may ask you questions about your: Alcohol use. Tobacco use. Drug use. Emotional well-being. Home and relationship well-being. Sexual activity. Eating habits. History of falls. Memory and ability to understand (cognition). Work and work Statistician. Screening  You may have the following tests or measurements: Height, weight, and BMI. Blood pressure. Lipid and cholesterol levels. These may be checked every 5 years, or more frequently if you are over 44 years old. Skin check. Lung cancer screening. You may have this screening every year starting at age 22 if you have a 30-pack-year history of smoking and currently smoke or have quit within the past 15 years. Fecal occult blood test (FOBT) of the stool. You may have this test every year starting at age 12. Flexible sigmoidoscopy or colonoscopy. You may have a sigmoidoscopy every 5 years or a colonoscopy every 10 years  starting at age 58. Prostate cancer screening. Recommendations will vary depending on your family history and other risks. Hepatitis C blood test. Hepatitis B blood test. Sexually transmitted disease (STD) testing. Diabetes screening. This is done by checking your blood sugar (glucose) after you have not eaten for a while (fasting). You may have this done every 1-3 years. Abdominal aortic aneurysm (AAA) screening. You may need this if you are a current or former smoker. Osteoporosis. You may be screened starting at age 55 if you are at high risk. Talk with your health care provider about your test results, treatment options, and if necessary, the need  for more tests. Vaccines  Your health care provider may recommend certain vaccines, such as: Influenza vaccine. This is recommended every year. Tetanus, diphtheria, and acellular pertussis (Tdap, Td) vaccine. You may need a Td booster every 10 years. Zoster vaccine. You may need this after age 58. Pneumococcal 13-valent conjugate (PCV13) vaccine. One dose is recommended after age 13. Pneumococcal polysaccharide (PPSV23) vaccine. One dose is recommended after age 69. Talk to your health care provider about which screenings and vaccines you need and how often you need them. This information is not intended to replace advice given to you by your health care provider. Make sure you discuss any questions you have with your health care provider. Document Released: 09/01/2015 Document Revised: 04/24/2016 Document Reviewed: 06/06/2015 Elsevier Interactive Patient Education  2017 [employer] Prevention in the Home Falls can cause injuries. They can happen to people of all ages. There are many things you can do to make your home safe and to help prevent falls. What can I do on the outside of my home? Regularly fix the edges of walkways and driveways and fix any cracks. Remove anything that might make you trip as you walk through a door, such as a raised step or threshold. Trim any bushes or trees on the path to your home. Use bright outdoor lighting. Clear any walking paths of anything that might make someone trip, such as rocks or tools. Regularly check to see if handrails are loose or broken. Make sure that both sides of any steps have handrails. Any raised decks and porches should have guardrails on the edges. Have any leaves, snow, or ice cleared regularly. Use sand or salt on walking paths during winter. Clean up any spills in your garage right away. This includes oil or grease spills. What can I do in the bathroom? Use night lights. Install grab bars by the toilet and in the tub and  shower. Do not use towel bars as grab bars. Use non-skid mats or decals in the tub or shower. If you need to sit down in the shower, use a plastic, non-slip stool. Keep the floor dry. Clean up any water that spills on the floor as soon as it happens. Remove soap buildup in the tub or shower regularly. Attach bath mats securely with double-sided non-slip rug tape. Do not have throw rugs and other things on the floor that can make you trip. What can I do in the bedroom? Use night lights. Make sure that you have a light by your bed that is easy to reach. Do not use any sheets or blankets that are too big for your bed. They should not hang down onto the floor. Have a firm chair that has side arms. You can use this for support while you get dressed. Do not have throw rugs and other things on the floor that  can make you trip. What can I do in the kitchen? Clean up any spills right away. Avoid walking on wet floors. Keep items that you use a lot in easy-to-reach places. If you need to reach something above you, use a strong step stool that has a grab bar. Keep electrical cords out of the way. Do not use floor polish or wax that makes floors slippery. If you must use wax, use non-skid floor wax. Do not have throw rugs and other things on the floor that can make you trip. What can I do with my stairs? Do not leave any items on the stairs. Make sure that there are handrails on both sides of the stairs and use them. Fix handrails that are broken or loose. Make sure that handrails are as long as the stairways. Check any carpeting to make sure that it is firmly attached to the stairs. Fix any carpet that is loose or worn. Avoid having throw rugs at the top or bottom of the stairs. If you do have throw rugs, attach them to the floor with carpet tape. Make sure that you have a light switch at the top of the stairs and the bottom of the stairs. If you do not have them, ask someone to add them for  you. What else can I do to help prevent falls? Wear shoes that: Do not have high heels. Have rubber bottoms. Are comfortable and fit you well. Are closed at the toe. Do not wear sandals. If you use a stepladder: Make sure that it is fully opened. Do not climb a closed stepladder. Make sure that both sides of the stepladder are locked into place. Ask someone to hold it for you, if possible. Clearly mark and make sure that you can see: Any grab bars or handrails. First and last steps. Where the edge of each step is. Use tools that help you move around (mobility aids) if they are needed. These include: Canes. Walkers. Scooters. Crutches. Turn on the lights when you go into a dark area. Replace any light bulbs as soon as they burn out. Set up your furniture so you have a clear path. Avoid moving your furniture around. If any of your floors are uneven, fix them. If there are any pets around you, be aware of where they are. Review your medicines with your doctor. Some medicines can make you feel dizzy. This can increase your chance of falling. Ask your doctor what other things that you can do to help prevent falls. This information is not intended to replace advice given to you by your health care provider. Make sure you discuss any questions you have with your health care provider. Document Released: 06/01/2009 Document Revised: 01/11/2016 Document Reviewed: 09/09/2014 Elsevier Interactive Patient Education  2017 [employer].

## 2022-03-22 DIAGNOSIS — H401122 Primary open-angle glaucoma, left eye, moderate stage: Secondary | ICD-10-CM | POA: Diagnosis not present

## 2022-03-22 DIAGNOSIS — H5789 Other specified disorders of eye and adnexa: Secondary | ICD-10-CM | POA: Diagnosis not present

## 2022-03-22 DIAGNOSIS — H11003 Unspecified pterygium of eye, bilateral: Secondary | ICD-10-CM | POA: Diagnosis not present

## 2022-03-22 DIAGNOSIS — Z961 Presence of intraocular lens: Secondary | ICD-10-CM | POA: Diagnosis not present

## 2022-03-22 DIAGNOSIS — H2511 Age-related nuclear cataract, right eye: Secondary | ICD-10-CM | POA: Diagnosis not present

## 2022-03-22 DIAGNOSIS — H40051 Ocular hypertension, right eye: Secondary | ICD-10-CM | POA: Diagnosis not present

## 2022-04-23 DIAGNOSIS — H1132 Conjunctival hemorrhage, left eye: Secondary | ICD-10-CM | POA: Diagnosis not present

## 2022-04-23 DIAGNOSIS — H401122 Primary open-angle glaucoma, left eye, moderate stage: Secondary | ICD-10-CM | POA: Diagnosis not present

## 2022-04-23 DIAGNOSIS — Z961 Presence of intraocular lens: Secondary | ICD-10-CM | POA: Diagnosis not present

## 2022-04-23 DIAGNOSIS — H11003 Unspecified pterygium of eye, bilateral: Secondary | ICD-10-CM | POA: Diagnosis not present

## 2022-04-23 DIAGNOSIS — H2511 Age-related nuclear cataract, right eye: Secondary | ICD-10-CM | POA: Diagnosis not present

## 2022-04-23 DIAGNOSIS — H5789 Other specified disorders of eye and adnexa: Secondary | ICD-10-CM | POA: Diagnosis not present

## 2022-04-23 DIAGNOSIS — H40051 Ocular hypertension, right eye: Secondary | ICD-10-CM | POA: Diagnosis not present

## 2022-04-25 DIAGNOSIS — R3915 Urgency of urination: Secondary | ICD-10-CM | POA: Diagnosis not present

## 2022-04-29 ENCOUNTER — Other Ambulatory Visit: Payer: Self-pay | Admitting: Urology

## 2022-04-29 DIAGNOSIS — C61 Malignant neoplasm of prostate: Secondary | ICD-10-CM

## 2022-05-28 ENCOUNTER — Ambulatory Visit
Admission: RE | Admit: 2022-05-28 | Discharge: 2022-05-28 | Disposition: A | Discharge status: Home / Self Care | Payer: Medicare Other | Source: Ambulatory Visit | Attending: Urology | Admitting: Urology

## 2022-05-28 DIAGNOSIS — R590 Localized enlarged lymph nodes: Secondary | ICD-10-CM | POA: Diagnosis not present

## 2022-05-28 DIAGNOSIS — C61 Malignant neoplasm of prostate: Secondary | ICD-10-CM

## 2022-05-28 MED ORDER — GADOBENATE DIMEGLUMINE 529 MG/ML IV SOLN
15.0000 mL | Freq: Once | INTRAVENOUS | Status: AC | PRN
  Administered 2022-05-28: 15 mL via INTRAVENOUS

## 2022-07-29 DIAGNOSIS — H40051 Ocular hypertension, right eye: Secondary | ICD-10-CM | POA: Diagnosis not present

## 2022-07-29 DIAGNOSIS — H2511 Age-related nuclear cataract, right eye: Secondary | ICD-10-CM | POA: Diagnosis not present

## 2022-07-29 DIAGNOSIS — Z961 Presence of intraocular lens: Secondary | ICD-10-CM | POA: Diagnosis not present

## 2022-07-29 DIAGNOSIS — H11003 Unspecified pterygium of eye, bilateral: Secondary | ICD-10-CM | POA: Diagnosis not present

## 2022-07-29 DIAGNOSIS — H401122 Primary open-angle glaucoma, left eye, moderate stage: Secondary | ICD-10-CM | POA: Diagnosis not present

## 2022-09-03 ENCOUNTER — Encounter: Payer: Self-pay | Admitting: Family Medicine

## 2022-09-03 ENCOUNTER — Ambulatory Visit (INDEPENDENT_AMBULATORY_CARE_PROVIDER_SITE_OTHER): Payer: Medicare Other | Admitting: Family Medicine

## 2022-09-03 VITALS — BP 128/82 | HR 78 | Temp 97.6°F | Wt 167.2 lb

## 2022-09-03 DIAGNOSIS — J069 Acute upper respiratory infection, unspecified: Secondary | ICD-10-CM | POA: Diagnosis not present

## 2022-09-03 DIAGNOSIS — N529 Male erectile dysfunction, unspecified: Secondary | ICD-10-CM | POA: Diagnosis not present

## 2022-09-03 MED ORDER — HYDROCODONE BIT-HOMATROP MBR 5-1.5 MG/5ML PO SOLN: 5.0000 mL | ORAL | 0 refills | Status: DC | PRN

## 2022-09-03 MED ORDER — SILDENAFIL CITRATE 100 MG PO TABS: 100.0000 mg | ORAL_TABLET | ORAL | 11 refills | Status: DC | PRN

## 2022-09-03 NOTE — Progress Notes (Signed)
   Subjective:    Patient ID: Peter Bond, male    DOB: 08-13-1946, 77 y.o.   MRN: [id number]  HPI Here for 2 issues. First he has had a dry cough for the past week. Otherwise he feels well, no fever or ST or body aches. Also he asks for help with erections. Lately he has had difficulty getting and maintaining erections. He says his desire is still strong.    Review of Systems  Constitutional: Negative.   HENT: Negative.    Eyes: Negative.   Respiratory:  Positive for cough. Negative for shortness of breath and wheezing.   Cardiovascular: Negative.        Objective:   Physical Exam Constitutional:      Appearance: Normal appearance. He is not ill-appearing.  HENT:     Right Ear: Tympanic membrane, ear canal and external ear normal.     Left Ear: Tympanic membrane, ear canal and external ear normal.     Nose: Nose normal.     Mouth/Throat:     Pharynx: Oropharynx is clear.  Eyes:     Conjunctiva/sclera: Conjunctivae normal.  Cardiovascular:     Rate and Rhythm: Normal rate and regular rhythm.     Pulses: Normal pulses.     Heart sounds: Normal heart sounds.  Pulmonary:     Effort: Pulmonary effort is normal.     Breath sounds: Normal breath sounds.  Lymphadenopathy:     Cervical: No cervical adenopathy.  Neurological:     Mental Status: He is alert.           Assessment & Plan:  He has a viral URI. He will drink fluids and use Hycodan as needed. For the erection dysfunction, he will try Viagra.  Alysia Penna, MD

## 2022-09-04 ENCOUNTER — Telehealth: Payer: Self-pay | Admitting: Family Medicine

## 2022-09-04 MED ORDER — CODEINE SULFATE 15 MG PO TABS: ORAL_TABLET | ORAL | 0 refills | Status: DC

## 2022-09-04 NOTE — Telephone Encounter (Signed)
Pharmacy updated to [facility].

## 2022-09-04 NOTE — Telephone Encounter (Signed)
I sent in codeine tablets

## 2022-09-04 NOTE — Telephone Encounter (Signed)
Pt was seen yesterday (09/03/22) Pt is calling to request the generic codeine for cough. Pt states cough medicine prescribed was too expensive. Please advise.  [facility] [phone] - [address], [place] AT [address] [phone]

## 2022-09-05 NOTE — Telephone Encounter (Signed)
Called patient informed requested prescription was sent on 09/04/22

## 2022-09-13 IMAGING — MR MR HEAD WO/W CM
15 of 19 series · 33 of 48 positions shown · IV contrast (8ml Multihance)
Comparison: 10/12/2019 MRI head and prior.

CLINICAL DATA: Pituitary tumor follow-up.

EXAM:
MRI HEAD WITHOUT AND WITH CONTRAST
TECHNIQUE: Multiplanar, multiecho pulse sequences of the brain and surrounding
structures were obtained without and with intravenous contrast.
CONTRAST:  8mL MULTIHANCE GADOBENATE DIMEGLUMINE 529 MG/ML IV SOLN

[Series 2: T1 · sagittal · 5.0mm · 0.45mm/px · 2 of 24 slices shown]
[im 1/24]
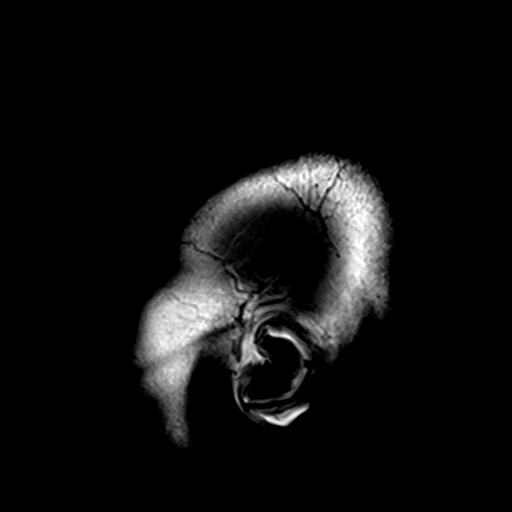
[im 24/24]
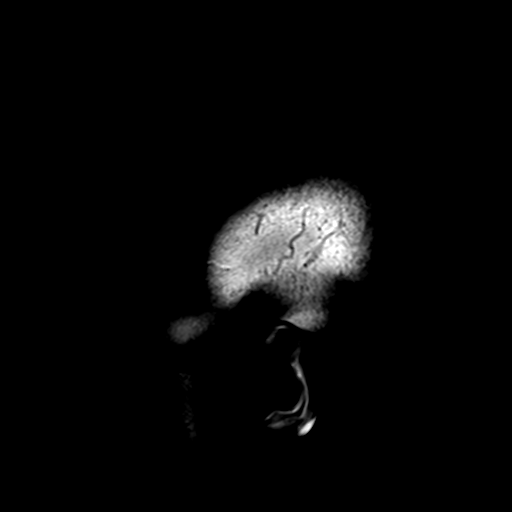

[Series 3: DWI · axial · 3.0mm · 1.80mm/px · z∈[-47,+99]mm · 8 of 100 slices shown]
[im 1/100]
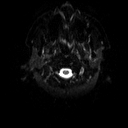
[im 12/100]
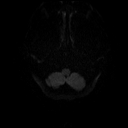
[im 34/100]
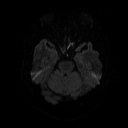
[im 45/100]
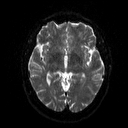
[im 56/100]
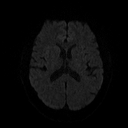
[im 67/100]
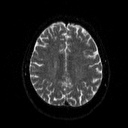
[im 89/100]
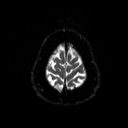
[im 100/100]
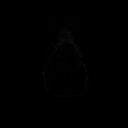

[Series 4: dwi_adc · axial · 3.0mm · 1.80mm/px · z∈[-47,+99]mm · 4 of 50 slices shown]
[im 1/50]
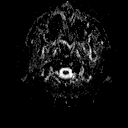
[im 17/50]
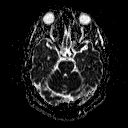
[im 33/50]
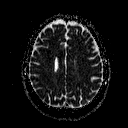
[im 50/50]
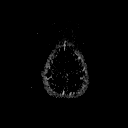

[Series 5: T2 · axial · 5.0mm · 0.36mm/px · z∈[-51,+97]mm · 3 of 24 slices shown]
[im 1/24]
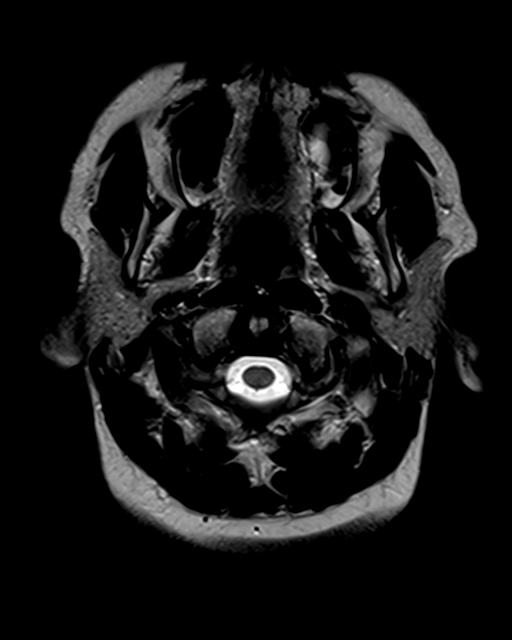
[im 12/24]
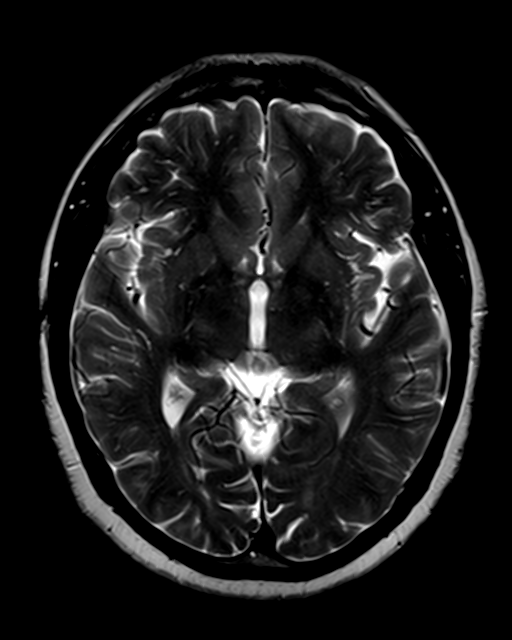
[im 24/24]
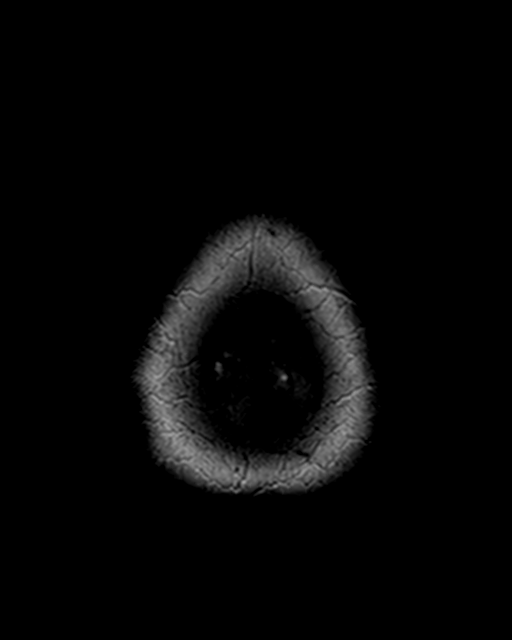

[Series 6: FLAIR · axial · 3.0mm · 0.45mm/px · z∈[-48,+95]mm · 3 of 32 slices shown]
[im 1/32]
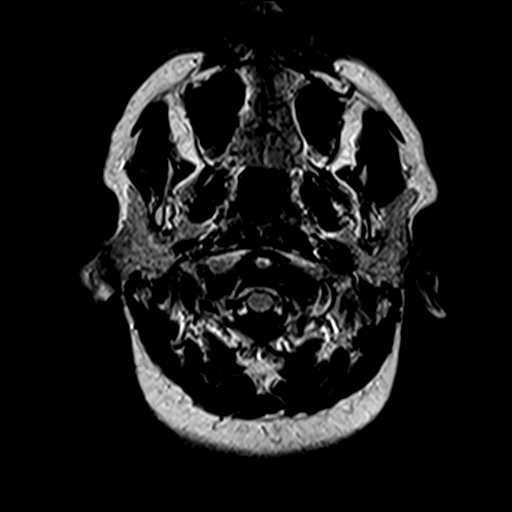
[im 16/32]
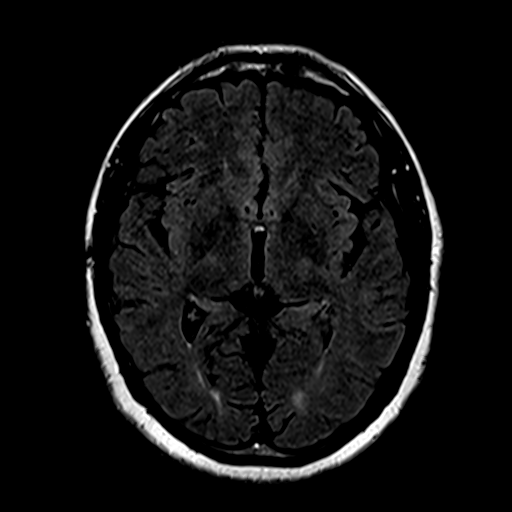
[im 32/32]
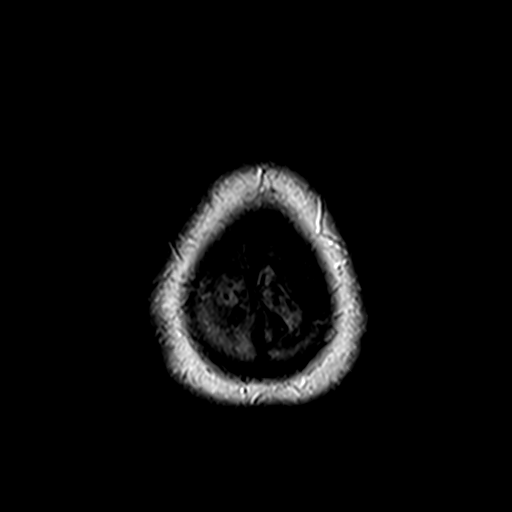

[Series 8: swi_images · axial · 4.0mm · 0.94mm/px · z∈[-46,+93]mm · 4 of 36 slices shown]
[im 1/36]
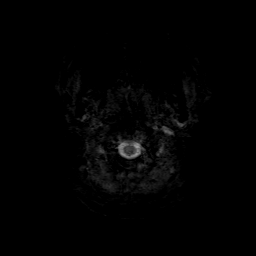
[im 12/36]
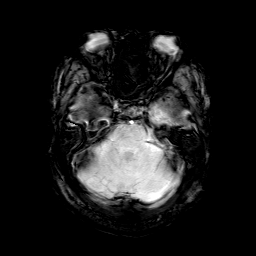
[im 24/36]
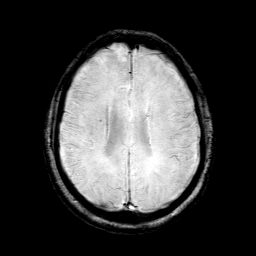
[im 36/36]
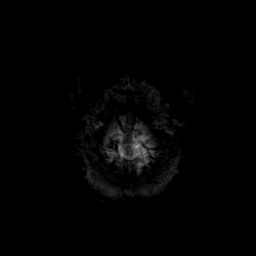

[Series 9: sag 3mm · sagittal · 3.0mm · 0.33mm/px · 1 of 11 slices shown]
[im 1/11]
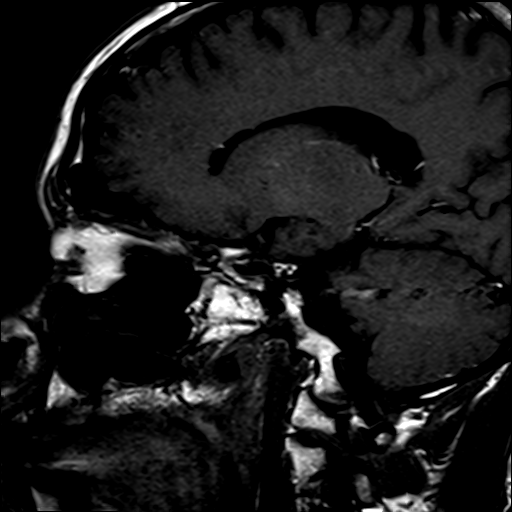

[Series 10: cor 3mm · coronal · 3.0mm · 0.33mm/px · 1 of 9 slices shown]
[im 1/9]
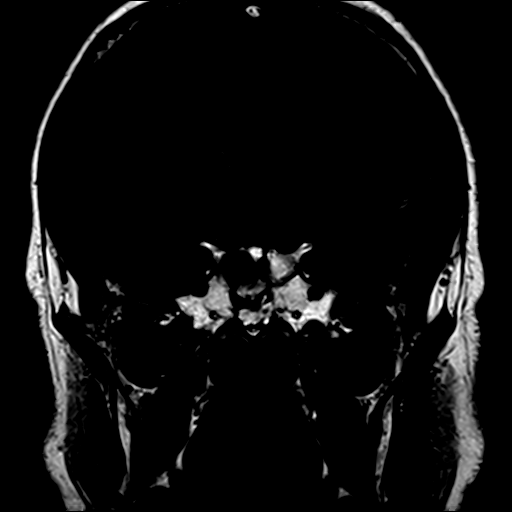

[Series 11: pre cor dynamic · coronal · non-contrast · 3.0mm · 0.35mm/px · 1 of 7 slices shown]
[im 1/7]
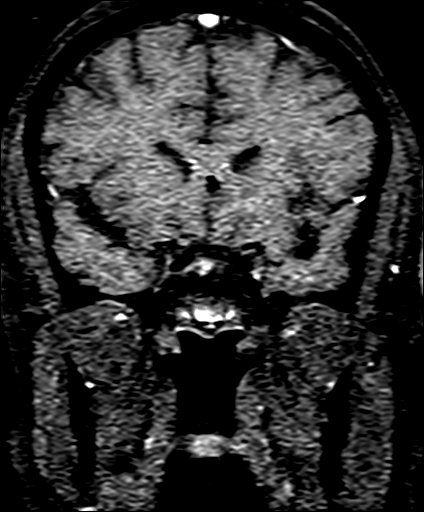

[Series 12: post fs cor · coronal · 3.0mm · 0.35mm/px · 1 of 7 slices shown (1 of 6)]
[im 1/7]
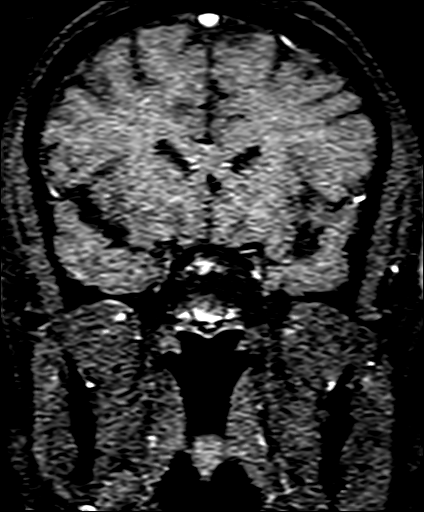

[Series 13: post fs cor · coronal · 3.0mm · 0.35mm/px · 1 of 7 slices shown (2 of 6)]
[im 1/7]
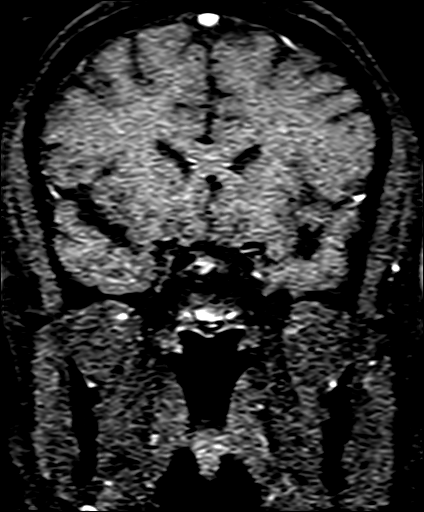

[Series 14: post fs cor · coronal · 3.0mm · 0.35mm/px · 1 of 7 slices shown (3 of 6)]
[im 1/7]
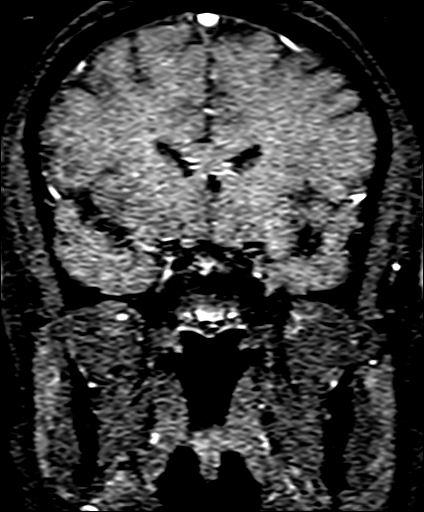

[Series 15: post fs cor · coronal · 3.0mm · 0.35mm/px · 1 of 7 slices shown (4 of 6)]
[im 1/7]
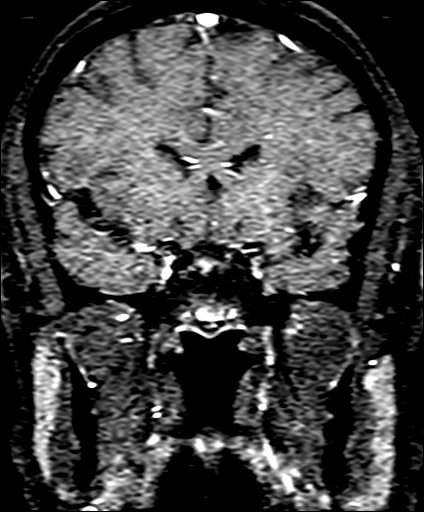

[Series 16: post fs cor · coronal · 3.0mm · 0.35mm/px · 1 of 7 slices shown (5 of 6)]
[im 1/7]
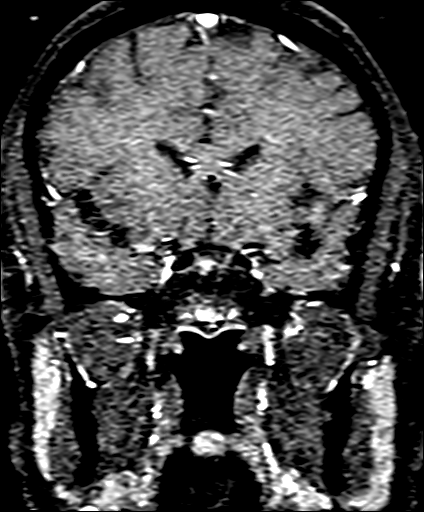

[Series 17: post fs cor · coronal · 3.0mm · 0.35mm/px · 1 of 7 slices shown (6 of 6)]
[im 1/7]
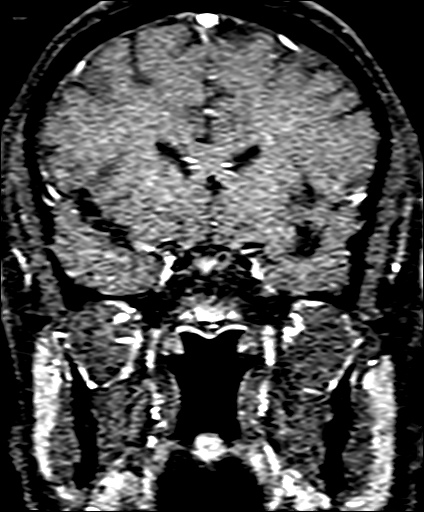

[33 of 48 positions shown; findings below may reference images not displayed]

FINDINGS: Brain: Mild chronic microvascular ischemic changes. Cerebral volume
is within normal limits. No acute infarct. Scattered remote
bilateral cerebral and left cerebellar microhemorrhages. No midline
shift, ventriculomegaly or extra-axial fluid collection. No mass
lesion. No abnormal enhancement.

Sequela of prior sella turcica mass resection with nasoseptal flap
reconstruction.Leftward deviation of the infundibulum, unchanged.
The optic chiasm is unremarkable.

Vascular: Normal flow voids. Small right frontal developmental
venous anomaly.

Skull and upper cervical spine: Normal marrow signal.

Sinuses/Orbits: Sequela of left lens replacement. No mastoid
effusion. Minimal ethmoid mucosal thickening.

Other: None.
IMPRESSION: Stable postsurgical appearance of the sella turcica.

Mild chronic microvascular ischemic changes, similar to prior exam.

## 2022-10-15 ENCOUNTER — Other Ambulatory Visit: Payer: Self-pay | Admitting: Family Medicine

## 2022-11-27 DIAGNOSIS — H40051 Ocular hypertension, right eye: Secondary | ICD-10-CM | POA: Diagnosis not present

## 2022-11-27 DIAGNOSIS — H11003 Unspecified pterygium of eye, bilateral: Secondary | ICD-10-CM | POA: Diagnosis not present

## 2022-11-27 DIAGNOSIS — H2511 Age-related nuclear cataract, right eye: Secondary | ICD-10-CM | POA: Diagnosis not present

## 2022-11-27 DIAGNOSIS — Z961 Presence of intraocular lens: Secondary | ICD-10-CM | POA: Diagnosis not present

## 2022-11-27 DIAGNOSIS — H401122 Primary open-angle glaucoma, left eye, moderate stage: Secondary | ICD-10-CM | POA: Diagnosis not present

## 2022-12-19 ENCOUNTER — Encounter: Payer: Self-pay | Admitting: Family Medicine

## 2022-12-19 ENCOUNTER — Ambulatory Visit (INDEPENDENT_AMBULATORY_CARE_PROVIDER_SITE_OTHER): Payer: Medicare Other | Admitting: Family Medicine

## 2022-12-19 VITALS — BP 120/78 | HR 75 | Temp 98.0°F | Wt 166.8 lb

## 2022-12-19 DIAGNOSIS — S39012A Strain of muscle, fascia and tendon of lower back, initial encounter: Secondary | ICD-10-CM

## 2022-12-19 DIAGNOSIS — N529 Male erectile dysfunction, unspecified: Secondary | ICD-10-CM | POA: Diagnosis not present

## 2022-12-19 MED ORDER — TADALAFIL 20 MG PO TABS: 10.0000 mg | ORAL_TABLET | ORAL | 11 refills | Status: DC | PRN

## 2022-12-19 NOTE — Progress Notes (Signed)
   Subjective:    Patient ID: Peter Bond, male    DOB: 1946/03/07, 77 y.o.   MRN: [id number]  HPI Here for 2 issues. First 3 days ago he had the sudden onset of pain in the left lower back. The pain radiated to the upper thigh. No numbness or tingling in the leg. He has been renovating a house and he has painted the entire interior of the by himself the past week. Yesterday he applied heat and he took Ibuprofen and Flexeril. Now today he feels much better and the pain is almost gone. The other issue is his ED. He has been using Viagra, but this has not been very helpful.    Review of Systems  Constitutional: Negative.   Respiratory: Negative.    Cardiovascular: Negative.   Musculoskeletal:  Positive for back pain.       Objective:   Physical Exam Constitutional:      General: He is not in acute distress.    Appearance: Normal appearance.  Cardiovascular:     Rate and Rhythm: Normal rate and regular rhythm.     Pulses: Normal pulses.     Heart sounds: Normal heart sounds.  Pulmonary:     Effort: Pulmonary effort is normal.     Breath sounds: Normal breath sounds.  Musculoskeletal:     Comments: Mildly tender in the left lower back. No sciatic notch tenderness. ROM is full   Neurological:     Mental Status: He is alert.           Assessment & Plan:  Lumbar strain. This has almost resolved. He can return if needed. For the ED, he will try Cialis 20 mg.  Gershon Crane, MD

## 2023-01-06 ENCOUNTER — Ambulatory Visit (INDEPENDENT_AMBULATORY_CARE_PROVIDER_SITE_OTHER): Payer: Medicare Other | Admitting: Family Medicine

## 2023-01-06 ENCOUNTER — Encounter: Payer: Self-pay | Admitting: Family Medicine

## 2023-01-06 VITALS — BP 118/78 | HR 66 | Temp 97.5°F | Ht 66.0 in | Wt 162.2 lb

## 2023-01-06 DIAGNOSIS — R030 Elevated blood-pressure reading, without diagnosis of hypertension: Secondary | ICD-10-CM | POA: Diagnosis not present

## 2023-01-06 DIAGNOSIS — Z961 Presence of intraocular lens: Secondary | ICD-10-CM | POA: Diagnosis not present

## 2023-01-06 DIAGNOSIS — H11003 Unspecified pterygium of eye, bilateral: Secondary | ICD-10-CM | POA: Diagnosis not present

## 2023-01-06 DIAGNOSIS — E785 Hyperlipidemia, unspecified: Secondary | ICD-10-CM

## 2023-01-06 DIAGNOSIS — K219 Gastro-esophageal reflux disease without esophagitis: Secondary | ICD-10-CM | POA: Diagnosis not present

## 2023-01-06 DIAGNOSIS — N529 Male erectile dysfunction, unspecified: Secondary | ICD-10-CM

## 2023-01-06 DIAGNOSIS — C61 Malignant neoplasm of prostate: Secondary | ICD-10-CM

## 2023-01-06 DIAGNOSIS — R739 Hyperglycemia, unspecified: Secondary | ICD-10-CM

## 2023-01-06 DIAGNOSIS — H40051 Ocular hypertension, right eye: Secondary | ICD-10-CM | POA: Diagnosis not present

## 2023-01-06 DIAGNOSIS — H401122 Primary open-angle glaucoma, left eye, moderate stage: Secondary | ICD-10-CM | POA: Diagnosis not present

## 2023-01-06 DIAGNOSIS — H2511 Age-related nuclear cataract, right eye: Secondary | ICD-10-CM | POA: Diagnosis not present

## 2023-01-06 LAB — LIPID PANEL
Cholesterol: 174 mg/dL (ref 0–200)
HDL: 59.6 mg/dL (ref >39.00)
LDL Cholesterol: 97 mg/dL (ref 0–99)
NonHDL: 114.33
Total CHOL/HDL Ratio: 3
Triglycerides: 89 mg/dL (ref 0.0–149.0)
VLDL: 17.8 mg/dL (ref 0.0–40.0)

## 2023-01-06 LAB — CBC WITH DIFFERENTIAL/PLATELET
Basophils Absolute: 0 10*3/uL (ref 0.0–0.1)
Basophils Relative: 0.7 % (ref 0.0–3.0)
Eosinophils Absolute: 0.1 10*3/uL (ref 0.0–0.7)
Eosinophils Relative: 2.7 % (ref 0.0–5.0)
HCT: 45.3 % (ref 39.0–52.0)
Hemoglobin: 15.3 g/dL (ref 13.0–17.0)
Lymphocytes Relative: 33.5 % (ref 12.0–46.0)
Lymphs Abs: 1.5 10*3/uL (ref 0.7–4.0)
MCHC: 33.8 g/dL (ref 30.0–36.0)
MCV: 86.3 fl (ref 78.0–100.0)
Monocytes Absolute: 0.6 10*3/uL (ref 0.1–1.0)
Monocytes Relative: 12.7 % — ABNORMAL HIGH (ref 3.0–12.0)
Neutro Abs: 2.3 10*3/uL (ref 1.4–7.7)
Neutrophils Relative %: 50.4 % (ref 43.0–77.0)
Platelets: 222 10*3/uL (ref 150.0–400.0)
RBC: 5.25 Mil/uL (ref 4.22–5.81)
RDW: 13.3 % (ref 11.5–15.5)
WBC: 4.5 10*3/uL (ref 4.0–10.5)

## 2023-01-06 LAB — BASIC METABOLIC PANEL
BUN: 20 mg/dL (ref 6–23)
CO2: 29 mEq/L (ref 19–32)
Calcium: 9.2 mg/dL (ref 8.4–10.5)
Chloride: 104 mEq/L (ref 96–112)
Creatinine, Ser: 1.39 mg/dL (ref 0.40–1.50)
GFR: 48.98 mL/min — ABNORMAL LOW (ref >60.00)
Glucose, Bld: 84 mg/dL (ref 70–99)
Potassium: 4.4 mEq/L (ref 3.5–5.1)
Sodium: 142 mEq/L (ref 135–145)

## 2023-01-06 LAB — HEPATIC FUNCTION PANEL
ALT: 16 U/L (ref 0–53)
AST: 17 U/L (ref 0–37)
Albumin: 4.2 g/dL (ref 3.5–5.2)
Alkaline Phosphatase: 77 U/L (ref 39–117)
Bilirubin, Direct: 0.1 mg/dL (ref 0.0–0.3)
Total Bilirubin: 0.8 mg/dL (ref 0.2–1.2)
Total Protein: 7 g/dL (ref 6.0–8.3)

## 2023-01-06 LAB — HEMOGLOBIN A1C: Hgb A1c MFr Bld: 5.9 % (ref 4.6–6.5)

## 2023-01-06 NOTE — Progress Notes (Signed)
Subjective:    Patient ID: Peter Bond, male    DOB: 07-28-1946, 77 y.o.   MRN: [id number]  HPI Here to follow up on issues. He sees Dr. Alvester Morin regularly for urologic exams and PSA checks. His BP has been stable. He continues to deal with some erection issues. He has tried Cialis and Viagra with poor results. He does mention a sharp pain in the left groin that began last week. Today it feels much better he notes that he was doing a lot of painting last week at home as well as cleaning out his garage.    Review of Systems  Constitutional: Negative.   HENT: Negative.    Eyes: Negative.   Respiratory: Negative.    Cardiovascular: Negative.   Gastrointestinal: Negative.   Genitourinary: Negative.   Musculoskeletal: Negative.   Skin: Negative.   Neurological: Negative.   Psychiatric/Behavioral: Negative.         Objective:   Physical Exam Constitutional:      General: He is not in acute distress.    Appearance: Normal appearance. He is well-developed. He is not diaphoretic.  HENT:     Head: Normocephalic and atraumatic.     Right Ear: External ear normal.     Left Ear: External ear normal.     Nose: Nose normal.     Mouth/Throat:     Pharynx: No oropharyngeal exudate.  Eyes:     General: No scleral icterus.       Right eye: No discharge.        Left eye: No discharge.     Conjunctiva/sclera: Conjunctivae normal.     Pupils: Pupils are equal, round, and reactive to light.  Neck:     Thyroid: No thyromegaly.     Vascular: No JVD.     Trachea: No tracheal deviation.  Cardiovascular:     Rate and Rhythm: Normal rate and regular rhythm.     Pulses: Normal pulses.     Heart sounds: Normal heart sounds. No murmur heard.    No friction rub. No gallop.  Pulmonary:     Effort: Pulmonary effort is normal. No respiratory distress.     Breath sounds: Normal breath sounds. No wheezing or rales.  Chest:     Chest wall: No tenderness.  Abdominal:     General: Bowel sounds are  normal. There is no distension.     Palpations: Abdomen is soft. There is no mass.     Tenderness: There is no abdominal tenderness. There is no guarding or rebound.  Genitourinary:    Penis: No tenderness.      Testes: Normal.     Comments: Left groin is not tender. No hernias are felt.  Musculoskeletal:        General: No tenderness. Normal range of motion.     Cervical back: Neck supple.  Lymphadenopathy:     Cervical: No cervical adenopathy.  Skin:    General: Skin is warm and dry.     Coloration: Skin is not pale.     Findings: No erythema or rash.  Neurological:     General: No focal deficit present.     Mental Status: He is alert and oriented to person, place, and time.     Cranial Nerves: No cranial nerve deficit.     Motor: No abnormal muscle tone.     Coordination: Coordination normal.     Deep Tendon Reflexes: Reflexes are normal and symmetric. Reflexes normal.  Psychiatric:  Behavior: Behavior normal.        Thought Content: Thought content normal.        Judgment: Judgment normal.           Assessment & Plan:  His BP is stable. He apparently strained a muscle in the left groin area, and this is healing up as expected. He has ED issues, and I advised him to ask Dr. Alvester Morin about possible solutions. Get fasting labs for lipids, etc. We spent a total of ( 33  ) minutes reviewing records and discussing these issues.  Gershon Crane, MD

## 2023-01-07 LAB — TSH: TSH: 0.96 u[IU]/mL (ref 0.35–5.50)

## 2023-03-19 ENCOUNTER — Ambulatory Visit (INDEPENDENT_AMBULATORY_CARE_PROVIDER_SITE_OTHER): Payer: Medicare Other

## 2023-03-19 VITALS — Ht 66.0 in | Wt 162.0 lb

## 2023-03-19 DIAGNOSIS — Z0000 Encounter for general adult medical examination without abnormal findings: Secondary | ICD-10-CM

## 2023-03-19 NOTE — Patient Instructions (Addendum)
Peter Bond , Thank you for taking time to come for your Medicare Wellness Visit. I appreciate your ongoing commitment to your health goals. Please review the following plan we discussed and let me know if I can assist you in the future.   Referrals/Orders/Follow-Ups/Clinician Recommendations:   This is a list of the screening recommended for you and due dates:  Health Maintenance  Topic Date Due   Hepatitis C Screening  Never done   Zoster (Shingles) Vaccine (1 of 2) Never done   COVID-19 Vaccine (3 - Pfizer risk series) 01/17/2024*   Flu Shot  03/20/2023   Medicare Annual Wellness Visit  03/18/2024   Colon Cancer Screening  12/20/2025   DTaP/Tdap/Td vaccine (4 - Td or Tdap) 09/18/2027   Pneumonia Vaccine  Completed   HPV Vaccine  Aged Out  *Topic was postponed. The date shown is not the original due date.    Advanced directives: (Copy Requested) Please bring a copy of your health care power of attorney and living will to the office to be added to your chart at your convenience.  Next Medicare Annual Wellness Visit scheduled for next year: Yes  Preventive Care 40 Years and Older, Male  Preventive care refers to lifestyle choices and visits with your health care provider that can promote health and wellness. What does preventive care include? A yearly physical exam. This is also called an annual well check. Dental exams once or twice a year. Routine eye exams. Ask your health care provider how often you should have your eyes checked. Personal lifestyle choices, including: Daily care of your teeth and gums. Regular physical activity. Eating a healthy diet. Avoiding tobacco and drug use. Limiting alcohol use. Practicing safe sex. Taking low doses of aspirin every day. Taking vitamin and mineral supplements as recommended by your health care provider. What happens during an annual well check? The services and screenings done by your health care provider during your annual well  check will depend on your age, overall health, lifestyle risk factors, and family history of disease. Counseling  Your health care provider may ask you questions about your: Alcohol use. Tobacco use. Drug use. Emotional well-being. Home and relationship well-being. Sexual activity. Eating habits. History of falls. Memory and ability to understand (cognition). Work and work Astronomer. Screening  You may have the following tests or measurements: Height, weight, and BMI. Blood pressure. Lipid and cholesterol levels. These may be checked every 5 years, or more frequently if you are over 53 years old. Skin check. Lung cancer screening. You may have this screening every year starting at age 35 if you have a 30-pack-year history of smoking and currently smoke or have quit within the past 15 years. Fecal occult blood test (FOBT) of the stool. You may have this test every year starting at age 82. Flexible sigmoidoscopy or colonoscopy. You may have a sigmoidoscopy every 5 years or a colonoscopy every 10 years starting at age 34. Prostate cancer screening. Recommendations will vary depending on your family history and other risks. Hepatitis C blood test. Hepatitis B blood test. Sexually transmitted disease (STD) testing. Diabetes screening. This is done by checking your blood sugar (glucose) after you have not eaten for a while (fasting). You may have this done every 1-3 years. Abdominal aortic aneurysm (AAA) screening. You may need this if you are a current or former smoker. Osteoporosis. You may be screened starting at age 6 if you are at high risk. Talk with your health care provider about  your test results, treatment options, and if necessary, the need for more tests. Vaccines  Your health care provider may recommend certain vaccines, such as: Influenza vaccine. This is recommended every year. Tetanus, diphtheria, and acellular pertussis (Tdap, Td) vaccine. You may need a Td booster  every 10 years. Zoster vaccine. You may need this after age 34. Pneumococcal 13-valent conjugate (PCV13) vaccine. One dose is recommended after age 96. Pneumococcal polysaccharide (PPSV23) vaccine. One dose is recommended after age 15. Talk to your health care provider about which screenings and vaccines you need and how often you need them. This information is not intended to replace advice given to you by your health care provider. Make sure you discuss any questions you have with your health care provider. Document Released: 09/01/2015 Document Revised: 04/24/2016 Document Reviewed: 06/06/2015 Elsevier Interactive Patient Education  2017 [employer].  Fall Prevention in the Home Falls can cause injuries. They can happen to people of all ages. There are many things you can do to make your home safe and to help prevent falls. What can I do on the outside of my home? Regularly fix the edges of walkways and driveways and fix any cracks. Remove anything that might make you trip as you walk through a door, such as a raised step or threshold. Trim any bushes or trees on the path to your home. Use bright outdoor lighting. Clear any walking paths of anything that might make someone trip, such as rocks or tools. Regularly check to see if handrails are loose or broken. Make sure that both sides of any steps have handrails. Any raised decks and porches should have guardrails on the edges. Have any leaves, snow, or ice cleared regularly. Use sand or salt on walking paths during winter. Clean up any spills in your garage right away. This includes oil or grease spills. What can I do in the bathroom? Use night lights. Install grab bars by the toilet and in the tub and shower. Do not use towel bars as grab bars. Use non-skid mats or decals in the tub or shower. If you need to sit down in the shower, use a plastic, non-slip stool. Keep the floor dry. Clean up any water that spills on the floor as soon  as it happens. Remove soap buildup in the tub or shower regularly. Attach bath mats securely with double-sided non-slip rug tape. Do not have throw rugs and other things on the floor that can make you trip. What can I do in the bedroom? Use night lights. Make sure that you have a light by your bed that is easy to reach. Do not use any sheets or blankets that are too big for your bed. They should not hang down onto the floor. Have a firm chair that has side arms. You can use this for support while you get dressed. Do not have throw rugs and other things on the floor that can make you trip. What can I do in the kitchen? Clean up any spills right away. Avoid walking on wet floors. Keep items that you use a lot in easy-to-reach places. If you need to reach something above you, use a strong step stool that has a grab bar. Keep electrical cords out of the way. Do not use floor polish or wax that makes floors slippery. If you must use wax, use non-skid floor wax. Do not have throw rugs and other things on the floor that can make you trip. What can I do with  my stairs? Do not leave any items on the stairs. Make sure that there are handrails on both sides of the stairs and use them. Fix handrails that are broken or loose. Make sure that handrails are as long as the stairways. Check any carpeting to make sure that it is firmly attached to the stairs. Fix any carpet that is loose or worn. Avoid having throw rugs at the top or bottom of the stairs. If you do have throw rugs, attach them to the floor with carpet tape. Make sure that you have a light switch at the top of the stairs and the bottom of the stairs. If you do not have them, ask someone to add them for you. What else can I do to help prevent falls? Wear shoes that: Do not have high heels. Have rubber bottoms. Are comfortable and fit you well. Are closed at the toe. Do not wear sandals. If you use a stepladder: Make sure that it is fully  opened. Do not climb a closed stepladder. Make sure that both sides of the stepladder are locked into place. Ask someone to hold it for you, if possible. Clearly mark and make sure that you can see: Any grab bars or handrails. First and last steps. Where the edge of each step is. Use tools that help you move around (mobility aids) if they are needed. These include: Canes. Walkers. Scooters. Crutches. Turn on the lights when you go into a dark area. Replace any light bulbs as soon as they burn out. Set up your furniture so you have a clear path. Avoid moving your furniture around. If any of your floors are uneven, fix them. If there are any pets around you, be aware of where they are. Review your medicines with your doctor. Some medicines can make you feel dizzy. This can increase your chance of falling. Ask your doctor what other things that you can do to help prevent falls. This information is not intended to replace advice given to you by your health care provider. Make sure you discuss any questions you have with your health care provider. Document Released: 06/01/2009 Document Revised: 01/11/2016 Document Reviewed: 09/09/2014 Elsevier Interactive Patient Education  2017 [employer].

## 2023-03-19 NOTE — Progress Notes (Signed)
Subjective:   Peter Bond is a 77 y.o. male who presents for Medicare Annual/Subsequent preventive examination.  Visit Complete: Virtual  I connected with  Peter Bond on 03/19/23 by a audio enabled telemedicine application and verified that I am speaking with the correct person using two identifiers.  Patient Location: Home  Provider Location: Home Office  I discussed the limitations of evaluation and management by telemedicine. The patient expressed understanding and agreed to proceed.  Patient Medicare AWV questionnaire was completed by the patient on  ; I have confirmed that all information answered by patient is correct and no changes since this date.  Review of Systems    Vital Signs: Unable to obtain new vitals due to this being a telehealth visit.  Cardiac Risk Factors include: advanced age (>52men, >30 women);male gender;dyslipidemia     Objective:    Today's Vitals   03/19/23 0827  Weight: 162 lb (73.5 kg)  Height: 5\' 6"  (1.676 m)   Body mass index is 26.15 kg/m.     03/19/2023    8:34 AM 03/15/2022    8:28 AM 03/14/2021   11:31 AM 03/08/2020   11:36 AM 12/31/2019    2:42 PM 09/18/2018   11:55 AM 09/17/2017   10:13 AM  Advanced Directives  Does Patient Have a Medical Advance Directive? Yes Yes Yes Yes Yes Yes Yes  Type of Estate agent of [place];Living will Healthcare Power of [place];Living will Living will Healthcare Power of [place];Living will Living will Living will   Does patient want to make changes to medical advance directive?  No - Patient declined   No - Patient declined    Copy of Healthcare Power of Attorney in Chart? No - copy requested No - copy requested  No - copy requested     Would patient like information on creating a medical advance directive?      Yes (MAU/Ambulatory/Procedural Areas - Information given)     Current Medications (verified) Outpatient Encounter Medications as of 03/19/2023  Medication Sig    aspirin EC 81 MG tablet Take 1 tablet (81 mg total) by mouth daily.   atorvastatin (LIPITOR) 40 MG tablet TAKE 1 TABLET BY MOUTH DAILY   cyclobenzaprine (FLEXERIL) 10 MG tablet Take 1 tablet (10 mg total) by mouth 3 (three) times daily as needed for muscle spasms.   erythromycin ophthalmic ointment SMARTSIG:sparingly Left Eye Twice Daily   fluorometholone (FML) 0.1 % ophthalmic suspension Place 1 drop into the left eye 2 (two) times daily.   Multiple Vitamin (MULTIVITAMIN) tablet Take 1 tablet by mouth daily.   sildenafil (VIAGRA) 100 MG tablet Take 1 tablet (100 mg total) by mouth as needed for erectile dysfunction.   tadalafil (CIALIS) 20 MG tablet Take 0.5-1 tablets (10-20 mg total) by mouth every other day as needed for erectile dysfunction.   No facility-administered encounter medications on file as of 03/19/2023.    Allergies (verified) Patient has no known allergies.   History: Past Medical History:  Diagnosis Date   Cataract    1980's cataract removed   GERD (gastroesophageal reflux disease)    Glaucoma    left eye    Hyperlipidemia    Prostate cancer [facility])    Past Surgical History:  Procedure Laterality Date   CATARACT EXTRACTION     1980's   COLONOSCOPY  06-19-15   per Dr. Adela Lank, adenomatous polyp and sigmoid diverticulosis, repeat in 5 yrs     COLONOSCOPY  12/20/2020   per Dr. Adela Lank,  internal hemorrhoids and diverticulosis, no polyps. No repeats needed.    corneal implant     PITUITARY EXCISION     pituitary gland removal   PROSTATE BIOPSY     Family History  Problem Relation Age of Onset   Prostate cancer Brother    Prostate cancer Father    Stroke Other    Prostate cancer Brother    Prostate cancer Brother    Colon cancer Neg Hx    Esophageal cancer Neg Hx    Rectal cancer Neg Hx    Stomach cancer Neg Hx    Breast cancer Neg Hx    Colon polyps Neg Hx    Social History   Socioeconomic History   Marital status: Married    Spouse name: Not  on file   Number of children: 1   Years of education: Not on file   Highest education level: Not on file  Occupational History    Comment: full time sales at [employer]  Tobacco Use   Smoking status: Never   Smokeless tobacco: Never  Vaping Use   Vaping status: Never Used  Substance and Sexual Activity   Alcohol use: Yes    Alcohol/week: 1.0 Bond drink of alcohol    Types: 1 Glasses of wine per week    Comment: rare   Drug use: No   Sexual activity: Yes  Other Topics Concern   Not on file  Social History Narrative   09/18/2018: Lives with wife and adopted son in multi-level home.    Son is deaf, no significant stress as caregiver   Works out daily at home doing crunches, works full time still in Retail banker. Does not want to retire.       Social Determinants of Health   Financial Resource Strain: Low Risk  (03/19/2023)   Overall Financial Resource Strain (CARDIA)    Difficulty of Paying Living Expenses: Not hard at all  Food Insecurity: No Food Insecurity (03/19/2023)   Hunger Vital Sign    Worried About Running Out of Food in the Last Year: Never true    Ran Out of Food in the Last Year: Never true  Transportation Needs: No Transportation Needs (03/19/2023)   PRAPARE - Administrator, Civil Service (Medical): No    Lack of Transportation (Non-Medical): No  Physical Activity: Sufficiently Active (03/19/2023)   Exercise Vital Sign    Days of Exercise per Week: 7 days    Minutes of Exercise per Session: 30 min  Stress: No Stress Concern Present (03/19/2023)   [employer] of Occupational Health - Occupational Stress Questionnaire    Feeling of Stress : Not at all  Social Connections: Socially Integrated (03/19/2023)   Social Connection and Isolation Panel [NHANES]    Frequency of Communication with Friends and Family: More than three times a week    Frequency of Social Gatherings with Friends and Family: More than three times a week    Attends  Religious Services: More than 4 times per year    Active Member of [employer] or Organizations: Yes    Attends Engineer, structural: More than 4 times per year    Marital Status: Married    Tobacco Counseling Counseling given: Not Answered   Clinical Intake:  Pre-visit preparation completed: No  Pain : No/denies pain     BMI - recorded: 26.15 Nutritional Status: BMI 25 -29 Overweight Nutritional Risks: None Diabetes: No  How often do you need to have  someone help you when you read instructions, pamphlets, or other written materials from your doctor or pharmacy?: 1 - Never  Interpreter Needed?: No  Information entered by :: Theresa Mulligan LPN   Activities of Daily Living    03/19/2023    8:33 AM  In your present state of health, do you have any difficulty performing the following activities:  Hearing? 0  Vision? 0  Difficulty concentrating or making decisions? 0  Walking or climbing stairs? 0  Dressing or bathing? 0  Doing errands, shopping? 0  Preparing Food and eating ? N  Using the Toilet? N  In the past six months, have you accidently leaked urine? N  Do you have problems with loss of bowel control? N  Managing your Medications? N  Managing your Finances? N  Housekeeping or managing your Housekeeping? N    Patient Care Team: Nelwyn Salisbury, MD as PCP - Keturah Barre, MD as Consulting Physician (Ophthalmology) Barnett Abu, MD as Consulting Physician (Gastroenterology)  Indicate any recent Medical Services you may have received from other than [facility] providers in the past year (date may be approximate).     Assessment:   This is a routine wellness examination for [place].  Hearing/Vision screen Hearing Screening - Comments:: Denies hearing difficulties   Vision Screening - Comments:: Wears rx glasses - up to date with routine eye exams with  Dr Dione Booze  Dietary issues and exercise activities discussed:     Goals Addressed                This Visit's Progress     Stay Healthy (pt-stated)         Depression Screen    03/19/2023    8:32 AM 01/06/2023    9:08 AM 03/15/2022    8:24 AM 03/06/2022    3:52 PM 01/02/2022    9:00 AM 03/14/2021   11:30 AM 01/01/2021   10:57 AM  PHQ 2/9 Scores  PHQ - 2 Score 0 0 0 0 0 0 2  PHQ- 9 Score  0 0 0 0  2    Fall Risk    03/19/2023    8:33 AM 01/06/2023    9:08 AM 03/15/2022    8:26 AM 03/06/2022    3:52 PM 01/02/2022    9:00 AM  Fall Risk   Falls in the past year? 0 0 0 0 0  Number falls in past yr: 0 0 0 0 0  Injury with Fall? 0 0 0 0 0  Risk for fall due to : No Fall Risks No Fall Risks No Fall Risks No Fall Risks No Fall Risks  Follow up Falls prevention discussed Falls evaluation completed  Falls evaluation completed Falls evaluation completed    MEDICARE RISK AT HOME:  Medicare Risk at Home - 03/19/23 0837     Any stairs in or around the home? Yes    If so, are there any without handrails? No    Home free of loose throw rugs in walkways, pet beds, electrical cords, etc? Yes    Adequate lighting in your home to reduce risk of falls? Yes    Life alert? No    Use of a cane, walker or w/c? No    Grab bars in the bathroom? No    Shower chair or bench in shower? No    Elevated toilet seat or a handicapped toilet? No             TIMED UP  AND GO:  Was the test performed?  No    Cognitive Function:    08/09/2016   11:30 AM  MMSE - Mini Mental State Exam  Not completed: --        03/19/2023    8:34 AM 03/15/2022    8:28 AM 03/14/2021   11:36 AM 03/08/2020   11:41 AM  6CIT Screen  What Year? 0 points 0 points 0 points 0 points  What month? 0 points 0 points 0 points 0 points  What time? 0 points 0 points 0 points 0 points  Count back from 20 0 points 0 points 0 points 0 points  Months in reverse 0 points 0 points 0 points 0 points  Repeat phrase 0 points 0 points 0 points 2 points  Total Score 0 points 0 points 0 points 2 points     Immunizations Immunization History  Administered Date(s) Administered   Fluad Quad(high Dose 65+) 09/27/2019   Influenza-Unspecified 07/19/2017, 06/19/2018   PFIZER(Purple Top)SARS-COV-2 Vaccination 02/02/2020, 02/25/2020   Pneumococcal Conjugate-13 09/23/2018   Pneumococcal Polysaccharide-23 09/27/2019   Td 08/20/1999, 10/23/2009   Tdap 09/17/2017    TDAP status: Up to date  Flu Vaccine status: Up to date  Pneumococcal vaccine status: Up to date  Covid-19 vaccine status: Completed vaccines  Qualifies for Shingles Vaccine? Yes   Zostavax completed No   Shingrix Completed?: No.    Education has been provided regarding the importance of this vaccine. Patient has been advised to call insurance company to determine out of pocket expense if they have not yet received this vaccine. Advised may also receive vaccine at local pharmacy or Health Dept. Verbalized acceptance and understanding.  Screening Tests Health Maintenance  Topic Date Due   Hepatitis C Screening  Never done   Zoster Vaccines- Shingrix (1 of 2) Never done   COVID-19 Vaccine (3 - Pfizer risk series) 01/17/2024 (Originally 03/24/2020)   INFLUENZA VACCINE  03/20/2023   Medicare Annual Wellness (AWV)  03/18/2024   Colonoscopy  12/20/2025   DTaP/Tdap/Td (4 - Td or Tdap) 09/18/2027   Pneumonia Vaccine 107+ Years old  Completed   HPV VACCINES  Aged Out    Health Maintenance  Health Maintenance Due  Topic Date Due   Hepatitis C Screening  Never done   Zoster Vaccines- Shingrix (1 of 2) Never done    Colorectal cancer screening: Type of screening: Colonoscopy. Completed 12/20/20. Repeat every 5 years  Lung Cancer Screening: (Low Dose CT Chest recommended if Age 38-80 years, 20 pack-year currently smoking OR have quit w/in 15years.) does not qualify.     Additional Screening:  Hepatitis C Screening: does qualify; Deferred  Vision Screening: Recommended annual ophthalmology exams for early detection of glaucoma  and other disorders of the eye. Is the patient up to date with their annual eye exam?  Yes  Who is the provider or what is the name of the office in which the patient attends annual eye exams? Dr Dione Booze If pt is not established with a provider, would they like to be referred to a provider to establish care? No .   Dental Screening: Recommended annual dental exams for proper oral hygiene   Community Resource Referral / Chronic Care Management:  CRR required this visit?  No   CCM required this visit?  No     Plan:     I have personally reviewed and noted the following in the patient's chart:   Medical and social history Use of alcohol, tobacco  or illicit drugs  Current medications and supplements including opioid prescriptions. Patient is not currently taking opioid prescriptions. Functional ability and status Nutritional status Physical activity Advanced directives List of other physicians Hospitalizations, surgeries, and ER visits in previous 12 months Vitals Screenings to include cognitive, depression, and falls Referrals and appointments  In addition, I have reviewed and discussed with patient certain preventive protocols, quality metrics, and best practice recommendations. A written personalized care plan for preventive services as well as general preventive health recommendations were provided to patient.     Tillie Rung, LPN   [id number]   After Visit Summary: (MyChart) Due to this being a telephonic visit, the after visit summary with patients personalized plan was offered to patient via MyChart   Nurse Notes: Patient due Hep-C Screening

## 2023-04-09 DIAGNOSIS — H40051 Ocular hypertension, right eye: Secondary | ICD-10-CM | POA: Diagnosis not present

## 2023-04-09 DIAGNOSIS — Z961 Presence of intraocular lens: Secondary | ICD-10-CM | POA: Diagnosis not present

## 2023-04-09 DIAGNOSIS — H401122 Primary open-angle glaucoma, left eye, moderate stage: Secondary | ICD-10-CM | POA: Diagnosis not present

## 2023-04-09 DIAGNOSIS — H11003 Unspecified pterygium of eye, bilateral: Secondary | ICD-10-CM | POA: Diagnosis not present

## 2023-04-09 DIAGNOSIS — H2511 Age-related nuclear cataract, right eye: Secondary | ICD-10-CM | POA: Diagnosis not present

## 2023-06-17 ENCOUNTER — Other Ambulatory Visit: Payer: Self-pay | Admitting: Family Medicine

## 2023-08-26 ENCOUNTER — Other Ambulatory Visit: Payer: Self-pay | Admitting: Family Medicine

## 2023-08-29 DIAGNOSIS — H40051 Ocular hypertension, right eye: Secondary | ICD-10-CM | POA: Diagnosis not present

## 2023-08-29 DIAGNOSIS — Z961 Presence of intraocular lens: Secondary | ICD-10-CM | POA: Diagnosis not present

## 2023-08-29 DIAGNOSIS — H401122 Primary open-angle glaucoma, left eye, moderate stage: Secondary | ICD-10-CM | POA: Diagnosis not present

## 2023-08-29 DIAGNOSIS — H11003 Unspecified pterygium of eye, bilateral: Secondary | ICD-10-CM | POA: Diagnosis not present

## 2023-08-29 DIAGNOSIS — H2511 Age-related nuclear cataract, right eye: Secondary | ICD-10-CM | POA: Diagnosis not present

## 2023-10-23 ENCOUNTER — Ambulatory Visit: Admitting: Family Medicine

## 2024-01-05 DIAGNOSIS — Z9889 Other specified postprocedural states: Secondary | ICD-10-CM | POA: Diagnosis not present

## 2024-01-05 DIAGNOSIS — Z961 Presence of intraocular lens: Secondary | ICD-10-CM | POA: Diagnosis not present

## 2024-01-05 DIAGNOSIS — H40051 Ocular hypertension, right eye: Secondary | ICD-10-CM | POA: Diagnosis not present

## 2024-01-05 DIAGNOSIS — H11003 Unspecified pterygium of eye, bilateral: Secondary | ICD-10-CM | POA: Diagnosis not present

## 2024-01-05 DIAGNOSIS — H401122 Primary open-angle glaucoma, left eye, moderate stage: Secondary | ICD-10-CM | POA: Diagnosis not present

## 2024-01-05 DIAGNOSIS — H2511 Age-related nuclear cataract, right eye: Secondary | ICD-10-CM | POA: Diagnosis not present

## 2024-01-21 ENCOUNTER — Other Ambulatory Visit: Payer: Self-pay | Admitting: Family Medicine

## 2024-02-16 ENCOUNTER — Encounter: Admitting: Family Medicine

## 2024-02-17 ENCOUNTER — Encounter: Payer: Self-pay | Admitting: Family Medicine

## 2024-02-17 ENCOUNTER — Ambulatory Visit (INDEPENDENT_AMBULATORY_CARE_PROVIDER_SITE_OTHER): Admitting: Family Medicine

## 2024-02-17 VITALS — BP 124/76 | HR 56 | Temp 97.9°F | Ht 66.0 in | Wt 172.6 lb

## 2024-02-17 DIAGNOSIS — C61 Malignant neoplasm of prostate: Secondary | ICD-10-CM

## 2024-02-17 DIAGNOSIS — N529 Male erectile dysfunction, unspecified: Secondary | ICD-10-CM

## 2024-02-17 DIAGNOSIS — H409 Unspecified glaucoma: Secondary | ICD-10-CM | POA: Diagnosis not present

## 2024-02-17 DIAGNOSIS — R030 Elevated blood-pressure reading, without diagnosis of hypertension: Secondary | ICD-10-CM | POA: Diagnosis not present

## 2024-02-17 DIAGNOSIS — K219 Gastro-esophageal reflux disease without esophagitis: Secondary | ICD-10-CM | POA: Diagnosis not present

## 2024-02-17 DIAGNOSIS — R739 Hyperglycemia, unspecified: Secondary | ICD-10-CM

## 2024-02-17 DIAGNOSIS — E785 Hyperlipidemia, unspecified: Secondary | ICD-10-CM | POA: Diagnosis not present

## 2024-02-17 DIAGNOSIS — E291 Testicular hypofunction: Secondary | ICD-10-CM

## 2024-02-17 MED ORDER — TADALAFIL 20 MG PO TABS: 20.0000 mg | ORAL_TABLET | ORAL | 11 refills | Status: AC | PRN

## 2024-02-17 NOTE — Progress Notes (Signed)
 Subjective:    Patient ID: Peter Bond, male    DOB: 1946-06-16, 78 y.o.   MRN: [id number]  HPI Here to follow up on issues. He feels well in general, but he still has some erection difficulties. He uses Cialis  and this helps to a degree. Otherwise he sees Urology several times a year. His BP has been stable. His GERD is well controlled.    Review of Systems  Constitutional: Negative.   HENT: Negative.    Eyes: Negative.   Respiratory: Negative.    Cardiovascular: Negative.   Gastrointestinal: Negative.   Genitourinary: Negative.   Musculoskeletal: Negative.   Skin: Negative.   Neurological: Negative.   Psychiatric/Behavioral: Negative.         Objective:   Physical Exam Constitutional:      General: He is not in acute distress.    Appearance: Normal appearance. He is well-developed. He is not diaphoretic.  HENT:     Head: Normocephalic and atraumatic.     Right Ear: External ear normal.     Left Ear: External ear normal.     Nose: Nose normal.     Mouth/Throat:     Pharynx: No oropharyngeal exudate.   Eyes:     General: No scleral icterus.       Right eye: No discharge.        Left eye: No discharge.     Conjunctiva/sclera: Conjunctivae normal.     Pupils: Pupils are equal, round, and reactive to light.   Neck:     Thyroid : No thyromegaly.     Vascular: No JVD.     Trachea: No tracheal deviation.   Cardiovascular:     Rate and Rhythm: Normal rate and regular rhythm.     Pulses: Normal pulses.     Heart sounds: Normal heart sounds. No murmur heard.    No friction rub. No gallop.  Pulmonary:     Effort: Pulmonary effort is normal. No respiratory distress.     Breath sounds: Normal breath sounds. No wheezing or rales.  Chest:     Chest wall: No tenderness.  Abdominal:     General: Bowel sounds are normal. There is no distension.     Palpations: Abdomen is soft. There is no mass.     Tenderness: There is no abdominal tenderness. There is no guarding or  rebound.  Genitourinary:    Penis: No tenderness.    Musculoskeletal:        General: No tenderness. Normal range of motion.     Cervical back: Neck supple.  Lymphadenopathy:     Cervical: No cervical adenopathy.   Skin:    General: Skin is warm and dry.     Coloration: Skin is not pale.     Findings: No erythema or rash.   Neurological:     General: No focal deficit present.     Mental Status: He is alert and oriented to person, place, and time.     Cranial Nerves: No cranial nerve deficit.     Motor: No abnormal muscle tone.     Coordination: Coordination normal.     Deep Tendon Reflexes: Reflexes are normal and symmetric. Reflexes normal.   Psychiatric:        Mood and Affect: Mood normal.        Behavior: Behavior normal.        Thought Content: Thought content normal.        Judgment: Judgment normal.  Assessment & Plan:  He seems to be doing well overall. His BP and his GERD are stable. He sees his eye doctor every 4 months, and his glaucoma is stable. We will get labs to check lipids, etc. For the ED we will check a testosterone level. We spent a total of (34   ) minutes reviewing records and discussing these issues.  Garnette Olmsted, MD

## 2024-02-18 ENCOUNTER — Ambulatory Visit: Payer: Self-pay | Admitting: Family Medicine

## 2024-02-18 DIAGNOSIS — E291 Testicular hypofunction: Secondary | ICD-10-CM

## 2024-02-18 DIAGNOSIS — C61 Malignant neoplasm of prostate: Secondary | ICD-10-CM

## 2024-02-18 DIAGNOSIS — N529 Male erectile dysfunction, unspecified: Secondary | ICD-10-CM

## 2024-02-18 LAB — CBC WITH DIFFERENTIAL/PLATELET
Basophils Absolute: 0 10*3/uL (ref 0.0–0.1)
Basophils Relative: 0.7 % (ref 0.0–3.0)
Eosinophils Absolute: 0.2 10*3/uL (ref 0.0–0.7)
Eosinophils Relative: 3.9 % (ref 0.0–5.0)
HCT: 42.2 % (ref 39.0–52.0)
Hemoglobin: 13.8 g/dL (ref 13.0–17.0)
Lymphocytes Relative: 40.6 % (ref 12.0–46.0)
Lymphs Abs: 1.7 10*3/uL (ref 0.7–4.0)
MCHC: 32.8 g/dL (ref 30.0–36.0)
MCV: 86.4 fl (ref 78.0–100.0)
Monocytes Absolute: 0.5 10*3/uL (ref 0.1–1.0)
Monocytes Relative: 12.2 % — ABNORMAL HIGH (ref 3.0–12.0)
Neutro Abs: 1.8 10*3/uL (ref 1.4–7.7)
Neutrophils Relative %: 42.6 % — ABNORMAL LOW (ref 43.0–77.0)
Platelets: 183 10*3/uL (ref 150.0–400.0)
RBC: 4.89 Mil/uL (ref 4.22–5.81)
RDW: 13.5 % (ref 11.5–15.5)
WBC: 4.2 10*3/uL (ref 4.0–10.5)

## 2024-02-18 LAB — HEMOGLOBIN A1C: Hgb A1c MFr Bld: 6 % (ref 4.6–6.5)

## 2024-02-18 LAB — BASIC METABOLIC PANEL WITH GFR
BUN: 18 mg/dL (ref 6–23)
CO2: 29 meq/L (ref 19–32)
Calcium: 9 mg/dL (ref 8.4–10.5)
Chloride: 103 meq/L (ref 96–112)
Creatinine, Ser: 1.46 mg/dL (ref 0.40–1.50)
GFR: 45.82 mL/min — ABNORMAL LOW (ref >60.00)
Glucose, Bld: 82 mg/dL (ref 70–99)
Potassium: 4 meq/L (ref 3.5–5.1)
Sodium: 139 meq/L (ref 135–145)

## 2024-02-18 LAB — LIPID PANEL
Cholesterol: 159 mg/dL (ref 0–200)
HDL: 62.9 mg/dL (ref >39.00)
LDL Cholesterol: 82 mg/dL (ref 0–99)
NonHDL: 95.82
Total CHOL/HDL Ratio: 3
Triglycerides: 68 mg/dL (ref 0.0–149.0)
VLDL: 13.6 mg/dL (ref 0.0–40.0)

## 2024-02-18 LAB — TSH: TSH: 1.55 u[IU]/mL (ref 0.35–5.50)

## 2024-02-18 LAB — HEPATIC FUNCTION PANEL
ALT: 16 U/L (ref 0–53)
AST: 20 U/L (ref 0–37)
Albumin: 4.4 g/dL (ref 3.5–5.2)
Alkaline Phosphatase: 93 U/L (ref 39–117)
Bilirubin, Direct: 0.1 mg/dL (ref 0.0–0.3)
Total Bilirubin: 0.6 mg/dL (ref 0.2–1.2)
Total Protein: 7 g/dL (ref 6.0–8.3)

## 2024-02-18 LAB — TESTOSTERONE: Testosterone: 276.84 ng/dL — ABNORMAL LOW (ref 300.00–890.00)

## 2024-02-19 ENCOUNTER — Telehealth: Payer: Self-pay | Admitting: *Deleted

## 2024-02-19 DIAGNOSIS — E291 Testicular hypofunction: Secondary | ICD-10-CM | POA: Insufficient documentation

## 2024-02-19 MED ORDER — TESTOSTERONE CYPIONATE 200 MG/ML IM SOLN: 200.0000 mg | INTRAMUSCULAR | 3 refills | Status: AC

## 2024-02-19 MED ORDER — SYRINGE 22G X 1-1/2" 3 ML MISC: 1.0000 | 2 refills | Status: AC

## 2024-02-19 NOTE — Addendum Note (Signed)
 Addended by: JOHNNY SENIOR A on: 02/19/2024 09:54 AM   Modules accepted: Orders

## 2024-02-19 NOTE — Telephone Encounter (Signed)
 Communication  Reason for CRM: Patient is requesting a call back, there was no documentation on what the missed call was about. Patient can be reached at [phone].

## 2024-02-19 NOTE — Progress Notes (Signed)
 Noted

## 2024-02-23 ENCOUNTER — Telehealth: Payer: Self-pay

## 2024-02-23 DIAGNOSIS — H11003 Unspecified pterygium of eye, bilateral: Secondary | ICD-10-CM | POA: Diagnosis not present

## 2024-02-23 DIAGNOSIS — H1013 Acute atopic conjunctivitis, bilateral: Secondary | ICD-10-CM | POA: Diagnosis not present

## 2024-02-23 DIAGNOSIS — Z961 Presence of intraocular lens: Secondary | ICD-10-CM | POA: Diagnosis not present

## 2024-02-23 DIAGNOSIS — H2511 Age-related nuclear cataract, right eye: Secondary | ICD-10-CM | POA: Diagnosis not present

## 2024-02-23 DIAGNOSIS — H401122 Primary open-angle glaucoma, left eye, moderate stage: Secondary | ICD-10-CM | POA: Diagnosis not present

## 2024-02-23 DIAGNOSIS — H40051 Ocular hypertension, right eye: Secondary | ICD-10-CM | POA: Diagnosis not present

## 2024-02-23 NOTE — Telephone Encounter (Signed)
 Copied from CRM [id number]. Topic: Clinical - Lab/Test Results >> Feb 23, 2024 10:00 AM Sasha H wrote: Reason for CRM: Pt is requesting a phone call with his lab work results.

## 2024-02-23 NOTE — Telephone Encounter (Signed)
 Spoke to pt. Please see other phone encounter.

## 2024-02-23 NOTE — Telephone Encounter (Signed)
 Spoke to pt. Pt brought up about receiving a call on his lab and that Dr. Johnny would treat him afterward he haven't get any update.   Inform him Dr. Johnny has sent in Rx for his low testosterone  injection every 14 days. Advise pt he can do self-injection or come in for nurse visit. Pt states he will give us  a call after picking up the Rx if need to come in. No further action is needed at this time.

## 2024-03-22 ENCOUNTER — Ambulatory Visit (INDEPENDENT_AMBULATORY_CARE_PROVIDER_SITE_OTHER): Payer: Medicare Other

## 2024-03-22 VITALS — Ht 66.0 in | Wt 172.0 lb

## 2024-03-22 DIAGNOSIS — Z0000 Encounter for general adult medical examination without abnormal findings: Secondary | ICD-10-CM | POA: Diagnosis not present

## 2024-03-22 NOTE — Progress Notes (Signed)
 Subjective:   Peter Bond is a 78 y.o. who presents for a Medicare Wellness preventive visit.  As a reminder, Annual Wellness Visits don't include a physical exam, and some assessments may be limited, especially if this visit is performed virtually. We may recommend an in-person follow-up visit with your provider if needed.  Visit Complete: Virtual I connected with  Dallas Pesa on 03/22/24 by a audio enabled telemedicine application and verified that I am speaking with the correct person using two identifiers.  Patient Location: Home  Provider Location: Home Office  I discussed the limitations of evaluation and management by telemedicine. The patient expressed understanding and agreed to proceed.  Vital Signs: Because this visit was a virtual/telehealth visit, some criteria may be missing or patient reported. Any vitals not documented were not able to be obtained and vitals that have been documented are patient reported.    Persons Participating in Visit: Patient.  AWV Questionnaire: No: Patient Medicare AWV questionnaire was not completed prior to this visit.  Cardiac Risk Factors include: advanced age (>28men, >71 women);male gender;dyslipidemia     Objective:    Today's Vitals   03/22/24 0808  Weight: 172 lb (78 kg)  Height: 5' 6 (1.676 m)   Body mass index is 27.76 kg/m.     03/22/2024    8:13 AM 03/19/2023    8:34 AM 03/15/2022    8:28 AM 03/14/2021   11:31 AM 03/08/2020   11:36 AM 12/31/2019    2:42 PM 09/18/2018   11:55 AM  Advanced Directives  Does Patient Have a Medical Advance Directive? Yes Yes Yes Yes Yes Yes Yes   Type of Estate agent of [place];Living will Healthcare Power of [place];Living will Healthcare Power of [place];Living will Living will Healthcare Power of [place];Living will Living will Living will  Does patient want to make changes to medical advance directive?   No - Patient declined   No - Patient declined   Copy  of Healthcare Power of Attorney in Chart? No - copy requested No - copy requested No - copy requested  No - copy requested    Would patient like information on creating a medical advance directive?       Yes (MAU/Ambulatory/Procedural Areas - Information given)      Data saved with a previous flowsheet row definition    Current Medications (verified) Outpatient Encounter Medications as of 03/22/2024  Medication Sig   aspirin  EC 81 MG tablet Take 1 tablet (81 mg total) by mouth daily.   atorvastatin  (LIPITOR) 40 MG tablet TAKE 1 TABLET BY MOUTH DAILY   cyclobenzaprine  (FLEXERIL ) 10 MG tablet Take 1 tablet (10 mg total) by mouth 3 (three) times daily as needed for muscle spasms.   erythromycin ophthalmic ointment SMARTSIG:sparingly Left Eye Twice Daily   fluorometholone (FML) 0.1 % ophthalmic suspension Place 1 drop into the left eye 2 (two) times daily.   Multiple Vitamin (MULTIVITAMIN) tablet Take 1 tablet by mouth daily.   Syringe/Needle, Disp, (SYRINGE 3CC/22GX1-1/2) 22G X 1-1/2 3 ML MISC 1 Application by Does not apply route every 14 (fourteen) days.   tadalafil  (CIALIS ) 20 MG tablet Take 1 tablet (20 mg total) by mouth every other day as needed for erectile dysfunction.   testosterone  cypionate (DEPOTESTOSTERONE CYPIONATE) 200 MG/ML injection Inject 1 mL (200 mg total) into the muscle every 14 (fourteen) days.   No facility-administered encounter medications on file as of 03/22/2024.    Allergies (verified) Patient has no known allergies.  History: Past Medical History:  Diagnosis Date   Cataract    1980's cataract removed   GERD (gastroesophageal reflux disease)    Glaucoma    left eye    Hyperlipidemia    Prostate cancer [facility])    Past Surgical History:  Procedure Laterality Date   CATARACT EXTRACTION     1980's   COLONOSCOPY  06-19-15   per Dr. Leigh, adenomatous polyp and sigmoid diverticulosis, repeat in 5 yrs     COLONOSCOPY  12/20/2020   per Dr. Leigh,  internal hemorrhoids and diverticulosis, no polyps. No repeats needed.    corneal implant     PITUITARY EXCISION     pituitary gland removal   PROSTATE BIOPSY     Family History  Problem Relation Age of Onset   Prostate cancer Brother    Prostate cancer Father    Stroke Other    Prostate cancer Brother    Prostate cancer Brother    Colon cancer Neg Hx    Esophageal cancer Neg Hx    Rectal cancer Neg Hx    Stomach cancer Neg Hx    Breast cancer Neg Hx    Colon polyps Neg Hx    Social History   Socioeconomic History   Marital status: Married    Spouse name: Not on file   Number of children: 1   Years of education: Not on file   Highest education level: Not on file  Occupational History    Comment: full time sales at [employer]  Tobacco Use   Smoking status: Never   Smokeless tobacco: Never  Vaping Use   Vaping status: Never Used  Substance and Sexual Activity   Alcohol use: Yes    Alcohol/week: 1.0 standard drink of alcohol    Types: 1 Glasses of wine per week    Comment: rare   Drug use: No   Sexual activity: Yes  Other Topics Concern   Not on file  Social History Narrative   09/18/2018: Lives with wife and adopted son in multi-level home.    Son is deaf, no significant stress as caregiver   Works out daily at home doing crunches, works full time still in Retail banker. Does not want to retire.       Social Drivers of Corporate investment banker Strain: Low Risk  (03/22/2024)   Overall Financial Resource Strain (CARDIA)    Difficulty of Paying Living Expenses: Not hard at all  Food Insecurity: No Food Insecurity (03/22/2024)   Hunger Vital Sign    Worried About Running Out of Food in the Last Year: Never true    Ran Out of Food in the Last Year: Never true  Transportation Needs: No Transportation Needs (03/22/2024)   PRAPARE - Administrator, Civil Service (Medical): No    Lack of Transportation (Non-Medical): No  Physical Activity: Sufficiently  Active (03/22/2024)   Exercise Vital Sign    Days of Exercise per Week: 7 days    Minutes of Exercise per Session: 40 min  Stress: No Stress Concern Present (03/22/2024)   [employer] of Occupational Health - Occupational Stress Questionnaire    Feeling of Stress: Not at all  Social Connections: Socially Integrated (03/22/2024)   Social Connection and Isolation Panel    Frequency of Communication with Friends and Family: More than three times a week    Frequency of Social Gatherings with Friends and Family: More than three times a week  Attends Religious Services: More than 4 times per year    Active Member of Clubs or Organizations: Yes    Attends Banker Meetings: More than 4 times per year    Marital Status: Married    Tobacco Counseling Counseling given: Not Answered    Clinical Intake:  Pre-visit preparation completed: Yes  Pain : No/denies pain     BMI - recorded: 27.76 Nutritional Status: BMI 25 -29 Overweight Nutritional Risks: None Diabetes: No  Lab Results  Component Value Date   HGBA1C 6.0 02/17/2024   HGBA1C 5.9 01/06/2023   HGBA1C 5.8 01/02/2022     How often do you need to have someone help you when you read instructions, pamphlets, or other written materials from your doctor or pharmacy?: 1 - Never  Interpreter Needed?: No  Information entered by :: Rojelio Blush LPN   Activities of Daily Living     03/22/2024    8:12 AM  In your present state of health, do you have any difficulty performing the following activities:  Hearing? 0  Vision? 0  Difficulty concentrating or making decisions? 0  Walking or climbing stairs? 0  Dressing or bathing? 0  Doing errands, shopping? 0  Preparing Food and eating ? N  Using the Toilet? N  In the past six months, have you accidently leaked urine? N  Do you have problems with loss of bowel control? N  Managing your Medications? N  Managing your Finances? N  Housekeeping or managing your  Housekeeping? N    Patient Care Team: Johnny Garnette LABOR, MD as PCP - Diedre Octavia Charleston, MD as Consulting Physician (Ophthalmology) Colon Shove, MD as Consulting Physician (Gastroenterology)  I have updated your Care Teams any recent Medical Services you may have received from other providers in the past year.     Assessment:   This is a routine wellness examination for Trea.  Hearing/Vision screen Hearing Screening - Comments:: Denies hearing difficulties   Vision Screening - Comments:: Wears rx glasses - up to date with routine eye exams with  Dr Octavia   Goals Addressed               This Visit's Progress     Remain active (pt-stated)         Depression Screen     03/22/2024    8:11 AM 03/19/2023    8:32 AM 01/06/2023    9:08 AM 03/15/2022    8:24 AM 03/06/2022    3:52 PM 01/02/2022    9:00 AM 03/14/2021   11:30 AM  PHQ 2/9 Scores  PHQ - 2 Score 0 0 0 0 0 0 0  PHQ- 9 Score   0 0 0 0     Fall Risk     03/22/2024    8:16 AM 03/19/2023    8:33 AM 01/06/2023    9:08 AM 03/15/2022    8:26 AM 03/06/2022    3:52 PM  Fall Risk   Falls in the past year? 0 0 0 0 0  Number falls in past yr: 0 0 0 0 0  Injury with Fall? 0 0 0 0 0  Risk for fall due to : No Fall Risks No Fall Risks No Fall Risks No Fall Risks No Fall Risks  Follow up Falls evaluation completed Falls prevention discussed Falls evaluation completed  Falls evaluation completed      Data saved with a previous flowsheet row definition    MEDICARE RISK AT HOME:  Medicare Risk at Home Any stairs in or around the home?: Yes If so, are there any without handrails?: No Home free of loose throw rugs in walkways, pet beds, electrical cords, etc?: Yes Adequate lighting in your home to reduce risk of falls?: Yes Life alert?: Yes Use of a cane, walker or w/c?: No Grab bars in the bathroom?: Yes Shower chair or bench in shower?: No Elevated toilet seat or a handicapped toilet?: Yes  TIMED UP AND GO:  Was the  test performed?  No  Cognitive Function: 6CIT completed    08/09/2016   11:30 AM  MMSE - Mini Mental State Exam  Not completed: --        03/22/2024    8:13 AM 03/19/2023    8:34 AM 03/15/2022    8:28 AM 03/14/2021   11:36 AM 03/08/2020   11:41 AM  6CIT Screen  What Year? 0 points 0 points 0 points 0 points 0 points  What month? 0 points 0 points 0 points 0 points 0 points  What time? 0 points 0 points 0 points 0 points 0 points  Count back from 20 0 points 0 points 0 points 0 points 0 points  Months in reverse 2 points 0 points 0 points 0 points 0 points  Repeat phrase 4 points 0 points 0 points 0 points 2 points  Total Score 6 points 0 points 0 points 0 points 2 points    Immunizations Immunization History  Administered Date(s) Administered   Fluad Quad(high Dose 65+) 09/27/2019   Influenza-Unspecified 07/19/2017, 06/19/2018   PFIZER(Purple Top)SARS-COV-2 Vaccination 02/02/2020, 02/25/2020   Pneumococcal Conjugate-13 09/23/2018   Pneumococcal Polysaccharide-23 09/27/2019   Td 08/20/1999, 10/23/2009   Tdap 09/17/2017    Screening Tests Health Maintenance  Topic Date Due   Hepatitis C Screening  Never done   COVID-19 Vaccine (3 - Pfizer risk series) 03/24/2020   INFLUENZA VACCINE  03/19/2024   Zoster Vaccines- Shingrix (1 of 2) 08/18/2025 (Originally 09/30/1964)   Medicare Annual Wellness (AWV)  03/22/2025   Colonoscopy  12/20/2025   DTaP/Tdap/Td (4 - Td or Tdap) 09/18/2027   Pneumococcal Vaccine: 50+ Years  Completed   Hepatitis B Vaccines  Aged Out   HPV VACCINES  Aged Out   Meningococcal B Vaccine  Aged Out    Health Maintenance  Health Maintenance Due  Topic Date Due   Hepatitis C Screening  Never done   COVID-19 Vaccine (3 - Pfizer risk series) 03/24/2020   INFLUENZA VACCINE  03/19/2024   Health Maintenance Items Addressed:   Additional Screening:  Vision Screening: Recommended annual ophthalmology exams for early detection of glaucoma and other  disorders of the eye. Would you like a referral to an eye doctor? No    Dental Screening: Recommended annual dental exams for proper oral hygiene  Community Resource Referral / Chronic Care Management: CRR required this visit?  No   CCM required this visit?  No   Plan:    I have personally reviewed and noted the following in the patient's chart:   Medical and social history Use of alcohol, tobacco or illicit drugs  Current medications and supplements including opioid prescriptions. Patient is not currently taking opioid prescriptions. Functional ability and status Nutritional status Physical activity Advanced directives List of other physicians Hospitalizations, surgeries, and ER visits in previous 12 months Vitals Screenings to include cognitive, depression, and falls Referrals and appointments  In addition, I have reviewed and discussed with patient certain preventive protocols, quality metrics, and  best practice recommendations. A written personalized care plan for preventive services as well as general preventive health recommendations were provided to patient.   Rojelio LELON Blush, LPN   08/23/7972   After Visit Summary: (MyChart) Due to this being a telephonic visit, the after visit summary with patients personalized plan was offered to patient via MyChart   Notes: Nothing significant to report at this time.

## 2024-03-22 NOTE — Patient Instructions (Addendum)
 Peter Bond , Thank you for taking time out of your busy schedule to complete your Annual Wellness Visit with me. I enjoyed our conversation and look forward to speaking with you again next year. I, as well as your care team,  appreciate your ongoing commitment to your health goals. Please review the following plan we discussed and let me know if I can assist you in the future. Your Game plan/ To Do List    Referrals: If you haven't heard from the office you've been referred to, please reach out to them at the phone provided.   Follow up Visits: We will see or speak with you next year for your Next Medicare AWV with our clinical staff 03/28/25 @ 8a Have you seen your provider in the last 6 months (3 months if uncontrolled diabetes)? Yes 02/17/24  Clinician Recommendations:  Aim for 30 minutes of exercise or brisk walking, 6-8 glasses of water, and 5 servings of fruits and vegetables each day.       This is a list of the screenings recommended for you:  Health Maintenance  Topic Date Due   Hepatitis C Screening  Never done   COVID-19 Vaccine (3 - Pfizer risk series) 03/24/2020   Flu Shot  03/19/2024   Zoster (Shingles) Vaccine (1 of 2) 08/18/2025*   Medicare Annual Wellness Visit  03/22/2025   Colon Cancer Screening  12/20/2025   DTaP/Tdap/Td vaccine (4 - Td or Tdap) 09/18/2027   Pneumococcal Vaccine for age over 23  Completed   Hepatitis B Vaccine  Aged Out   HPV Vaccine  Aged Out   Meningitis B Vaccine  Aged Out  *Topic was postponed. The date shown is not the original due date.    Advanced directives: (Copy Requested) Please bring a copy of your health care power of attorney and living will to the office to be added to your chart at your convenience. You can mail to [facility] [address] [place], [place] [address] or email to [email or web address] Advance Care Planning is important because it:  [x]  Makes sure you receive the medical care that is consistent  with your values, goals, and preferences  [x]  It provides guidance to your family and loved ones and reduces their decisional burden about whether or not they are making the right decisions based on your wishes.  Follow the link provided in your after visit summary or read over the paperwork we have mailed to you to help you started getting your Advance Directives in place. If you need assistance in completing these, please reach out to us  so that we can help you!  See attachments for Preventive Care and Fall Prevention Tips.

## 2024-03-24 ENCOUNTER — Other Ambulatory Visit: Payer: Self-pay | Admitting: Family Medicine

## 2024-03-29 ENCOUNTER — Ambulatory Visit

## 2024-03-29 DIAGNOSIS — E291 Testicular hypofunction: Secondary | ICD-10-CM | POA: Diagnosis not present

## 2024-03-29 MED ORDER — TESTOSTERONE CYPIONATE 200 MG/ML IM SOLN
200.0000 mg | INTRAMUSCULAR | Status: AC
  Administered 2024-03-29 (×2): 200 mg via INTRAMUSCULAR

## 2024-03-29 NOTE — Progress Notes (Signed)
 Patient is in office today for a nurse visit for Testosterone  Injection. Patient Injection was given in the  Left upper quad. gluteus. Patient tolerated injection well.

## 2024-04-23 ENCOUNTER — Encounter: Payer: Self-pay | Admitting: Family Medicine

## 2024-04-23 ENCOUNTER — Ambulatory Visit (INDEPENDENT_AMBULATORY_CARE_PROVIDER_SITE_OTHER): Admitting: Family Medicine

## 2024-04-23 VITALS — BP 126/78 | HR 66 | Temp 98.1°F | Wt 170.0 lb

## 2024-04-23 DIAGNOSIS — H6121 Impacted cerumen, right ear: Secondary | ICD-10-CM | POA: Diagnosis not present

## 2024-04-23 NOTE — Progress Notes (Signed)
   Subjective:    Patient ID: Peter Bond, male    DOB: [birth year], 78 y.o.   MRN: [id number]  HPI Here for one week of a small amount of brown fluid draining from the right ear. No ear pain. Hearing is normal.   Review of Systems  Constitutional: Negative.   HENT:  Positive for ear discharge. Negative for congestion, ear pain, hearing loss and sinus pain.   Eyes: Negative.   Respiratory: Negative.         Objective:   Physical Exam Constitutional:      Appearance: Normal appearance.  HENT:     Right Ear: There is impacted cerumen.     Left Ear: Tympanic membrane and ear canal normal.  Cardiovascular:     Rate and Rhythm: Normal rate and regular rhythm.     Pulses: Normal pulses.     Heart sounds: Normal heart sounds.  Pulmonary:     Effort: Pulmonary effort is normal.     Breath sounds: Normal breath sounds.  Lymphadenopathy:     Cervical: No cervical adenopathy.  Neurological:     Mental Status: He is alert.           Assessment & Plan:  Cerumen impaction. After informed consent was obtained the right ear canal was irrigated clear with water. He tolerated the procedure well.  Garnette Olmsted, MD

## 2024-04-27 DIAGNOSIS — H2511 Age-related nuclear cataract, right eye: Secondary | ICD-10-CM | POA: Diagnosis not present

## 2024-04-27 DIAGNOSIS — H1013 Acute atopic conjunctivitis, bilateral: Secondary | ICD-10-CM | POA: Diagnosis not present

## 2024-04-27 DIAGNOSIS — Z961 Presence of intraocular lens: Secondary | ICD-10-CM | POA: Diagnosis not present

## 2024-04-27 DIAGNOSIS — H401122 Primary open-angle glaucoma, left eye, moderate stage: Secondary | ICD-10-CM | POA: Diagnosis not present

## 2024-04-27 DIAGNOSIS — H40051 Ocular hypertension, right eye: Secondary | ICD-10-CM | POA: Diagnosis not present

## 2024-04-27 DIAGNOSIS — H11003 Unspecified pterygium of eye, bilateral: Secondary | ICD-10-CM | POA: Diagnosis not present

## 2024-05-14 ENCOUNTER — Other Ambulatory Visit: Payer: Self-pay | Admitting: Urology

## 2024-05-14 DIAGNOSIS — C61 Malignant neoplasm of prostate: Secondary | ICD-10-CM

## 2024-06-29 ENCOUNTER — Ambulatory Visit
Admission: RE | Admit: 2024-06-29 | Discharge: 2024-06-29 | Disposition: A | Discharge status: Home / Self Care | Source: Ambulatory Visit | Attending: Urology | Admitting: Urology

## 2024-06-29 DIAGNOSIS — C61 Malignant neoplasm of prostate: Secondary | ICD-10-CM

## 2024-06-29 MED ORDER — GADOPICLENOL 0.5 MMOL/ML IV SOLN
8.0000 mL | Freq: Once | INTRAVENOUS | Status: AC | PRN
  Administered 2024-06-29: 8 mL via INTRAVENOUS

## 2024-09-07 ENCOUNTER — Other Ambulatory Visit: Payer: Self-pay | Admitting: Family Medicine

## 2024-09-09 ENCOUNTER — Ambulatory Visit: Payer: Self-pay

## 2024-09-09 NOTE — Telephone Encounter (Signed)
 FYI Only or Action Required?: FYI only for provider: appointment scheduled on 1/26.  Patient was last seen in primary care on 04/23/2024 by Johnny Garnette LABOR, MD.  Called Nurse Triage reporting Hypertension.  Symptoms began today.  Interventions attempted: Nothing.  Symptoms are: stable.  Triage Disposition: See PCP Within 2 Weeks  Patient/caregiver understands and will follow disposition?: Yes   Reason for Triage: Robet w/ [facility] called in to advise PCP that patient BP was elevated 142/80 and 150/76. Denied having symptoms or having high BP.  Reason for Disposition  [1] Systolic BP >= 130 OR Diastolic >= 80 AND [2] not taking BP medications  Answer Assessment - Initial Assessment Questions 1. BLOOD PRESSURE: What is your blood pressure? Did you take at least two measurements 5 minutes apart?     142/80, 150/76 per Karlie with [facility]  2. ONSET: When did you take your blood pressure?     Today before NT   4. HISTORY: Do you have a history of high blood pressure?     Denies  5. MEDICINES: Are you taking any medicines for blood pressure? Have you missed any doses recently?     Denies  6. OTHER SYMPTOMS: Do you have any symptoms? (e.g., blurred vision, chest pain, difficulty breathing, headache, weakness) Denies  Protocols used: Blood Pressure - High-A-AH

## 2024-09-13 ENCOUNTER — Ambulatory Visit: Admitting: Family Medicine

## 2024-09-15 ENCOUNTER — Ambulatory Visit: Admitting: Family Medicine

## 2024-09-20 ENCOUNTER — Ambulatory Visit: Admitting: Family Medicine

## 2024-09-21 ENCOUNTER — Ambulatory Visit: Admitting: Family Medicine

## 2024-09-21 ENCOUNTER — Encounter: Payer: Self-pay | Admitting: Family Medicine

## 2024-09-21 VITALS — BP 128/72 | HR 56 | Temp 98.3°F | Wt 173.4 lb

## 2024-09-21 DIAGNOSIS — I10 Essential (primary) hypertension: Secondary | ICD-10-CM | POA: Insufficient documentation

## 2024-09-21 MED ORDER — AMLODIPINE BESYLATE 5 MG PO TABS: 5.0000 mg | ORAL_TABLET | Freq: Every day | ORAL | 2 refills | Status: AC

## 2024-09-21 NOTE — Progress Notes (Signed)
" ° °  Subjective:    Patient ID: Peter Bond, male    DOB: [birth year], 79 y.o.   MRN: [id number]  HPI Here with concerns about his BP. He has never had BP  problems before, but he was at another office recently and his BP was around 150 for the systolic reading. This alarmed him so he started checking this at home with his wife's cuff. Over the past  week he has averaged 140-150 systolic and 70-80 diastolic. He feels fine. He exercises daily and he plays pickleball twice a week. He tries to limit the salt in his diet. He notes that both of his parents had high BP.    Review of Systems  Constitutional: Negative.   Respiratory: Negative.    Cardiovascular: Negative.   Neurological: Negative.        Objective:   Physical Exam Constitutional:      Appearance: Normal appearance.  Cardiovascular:     Rate and Rhythm: Normal rate and regular rhythm.     Pulses: Normal pulses.     Heart sounds: Normal heart sounds.  Pulmonary:     Effort: Pulmonary effort is normal.     Breath sounds: Normal breath sounds.  Musculoskeletal:     Right lower leg: No edema.     Left lower leg: No edema.  Neurological:     Mental Status: He is alert.           Assessment & Plan:  New diagnosis of mild HTN. He will start taking Amlodipine  5 mg daily. He will follow the BP at home, and he will follow up here in 4 weeks.  Garnette Olmsted, MD   "

## 2024-10-19 ENCOUNTER — Ambulatory Visit: Admitting: Family Medicine

## 2025-03-28 ENCOUNTER — Ambulatory Visit
